# Patient Record
Sex: Female | Born: 1970 | Race: White | Hispanic: No | Marital: Married | Smoking: Former smoker
Health system: Southern US, Community
[De-identification: ages and names within clinical notes are randomized; demographics above are authoritative.]

## PROBLEM LIST (undated history)

## (undated) DIAGNOSIS — R87810 Cervical high risk human papillomavirus (HPV) DNA test positive: Secondary | ICD-10-CM

## (undated) DIAGNOSIS — F32A Depression, unspecified: Secondary | ICD-10-CM

## (undated) DIAGNOSIS — F329 Major depressive disorder, single episode, unspecified: Secondary | ICD-10-CM

## (undated) DIAGNOSIS — R52 Pain, unspecified: Secondary | ICD-10-CM

## (undated) DIAGNOSIS — R51 Headache: Secondary | ICD-10-CM

## (undated) DIAGNOSIS — H6692 Otitis media, unspecified, left ear: Secondary | ICD-10-CM

## (undated) DIAGNOSIS — J329 Chronic sinusitis, unspecified: Secondary | ICD-10-CM

## (undated) DIAGNOSIS — R87610 Atypical squamous cells of undetermined significance on cytologic smear of cervix (ASC-US): Secondary | ICD-10-CM

## (undated) HISTORY — DX: Pain, unspecified: R52

## (undated) HISTORY — DX: Headache: R51

## (undated) HISTORY — DX: Major depressive disorder, single episode, unspecified: F32.9

## (undated) HISTORY — DX: Chronic sinusitis, unspecified: J32.9

## (undated) HISTORY — DX: Cervical high risk human papillomavirus (HPV) DNA test positive: R87.810

## (undated) HISTORY — DX: Otitis media, unspecified, left ear: H66.92

## (undated) HISTORY — DX: Atypical squamous cells of undetermined significance on cytologic smear of cervix (ASC-US): R87.610

## (undated) HISTORY — DX: Depression, unspecified: F32.A

---

## 2009-07-07 ENCOUNTER — Other Ambulatory Visit: Admission: RE | Admit: 2009-07-07 | Discharge: 2009-07-07 | Payer: Self-pay | Admitting: Obstetrics & Gynecology

## 2009-08-31 ENCOUNTER — Ambulatory Visit (HOSPITAL_COMMUNITY): Admission: RE | Admit: 2009-08-31 | Discharge: 2009-08-31 | Payer: Self-pay | Admitting: Obstetrics & Gynecology

## 2009-09-21 ENCOUNTER — Ambulatory Visit (HOSPITAL_COMMUNITY): Admission: RE | Admit: 2009-09-21 | Discharge: 2009-09-21 | Payer: Self-pay | Admitting: Obstetrics & Gynecology

## 2009-10-19 ENCOUNTER — Ambulatory Visit (HOSPITAL_COMMUNITY): Admission: RE | Admit: 2009-10-19 | Discharge: 2009-10-19 | Payer: Self-pay | Admitting: Obstetrics & Gynecology

## 2009-11-16 ENCOUNTER — Ambulatory Visit (HOSPITAL_COMMUNITY): Admission: RE | Admit: 2009-11-16 | Discharge: 2009-11-16 | Payer: Self-pay | Admitting: Obstetrics & Gynecology

## 2009-12-07 ENCOUNTER — Ambulatory Visit (HOSPITAL_COMMUNITY): Admission: RE | Admit: 2009-12-07 | Discharge: 2009-12-07 | Payer: Self-pay | Admitting: Obstetrics & Gynecology

## 2011-02-03 IMAGING — US US OB FOLLOW-UP
1 series · 18 of 28 positions shown · non-contrast
Comparison: none

OBSTETRICAL ULTRASOUND:
 This ultrasound was performed in The [HOSPITAL], and the [facility] report will be stored to [REDACTED] PACS.  This report is also available in [HOSPITAL]?s accessANYware.

[Series 1: us ob follow-up · 49 acquisitions, 18 frames shown]
[im 1/49]
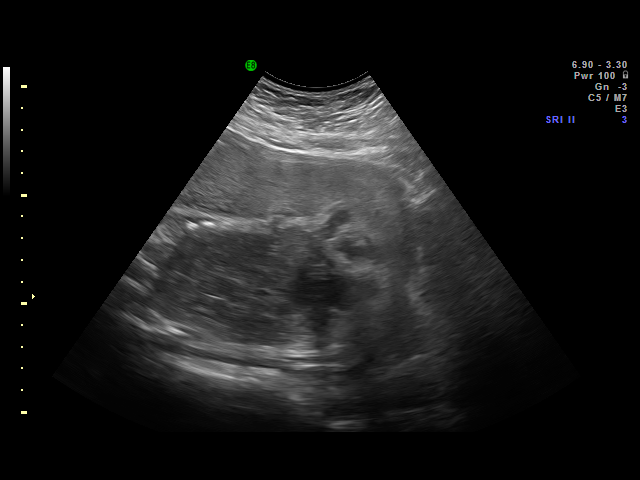
[im 4/49]
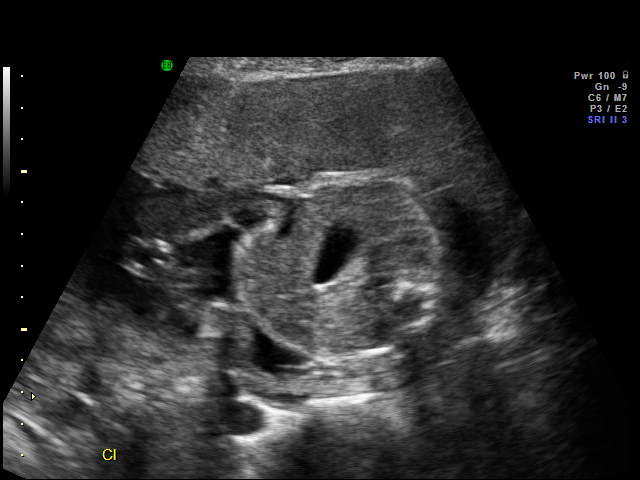
[im 6/49]
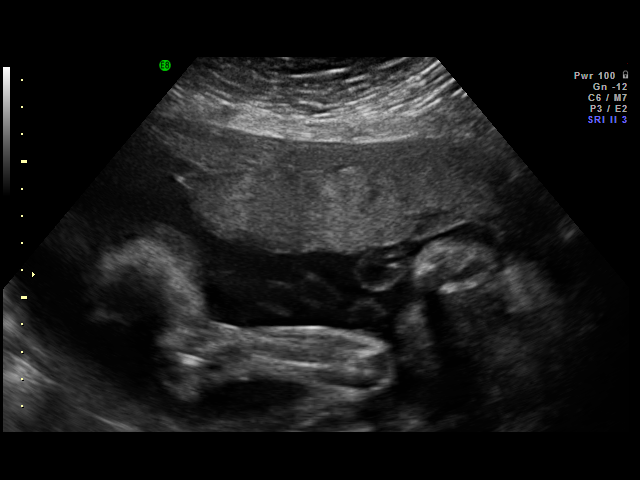
[im 9/49]
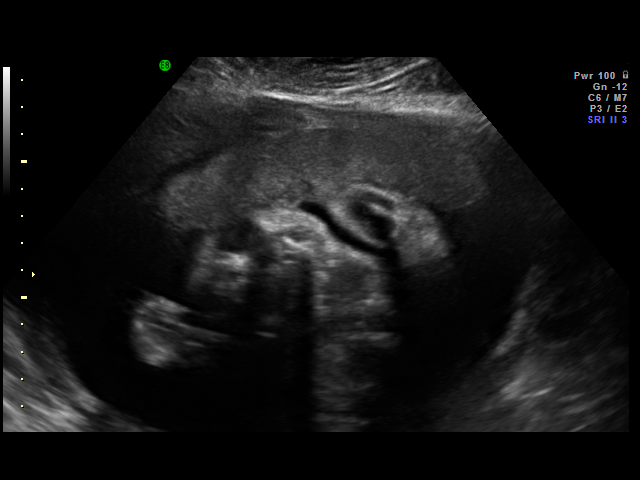
[im 13/49]
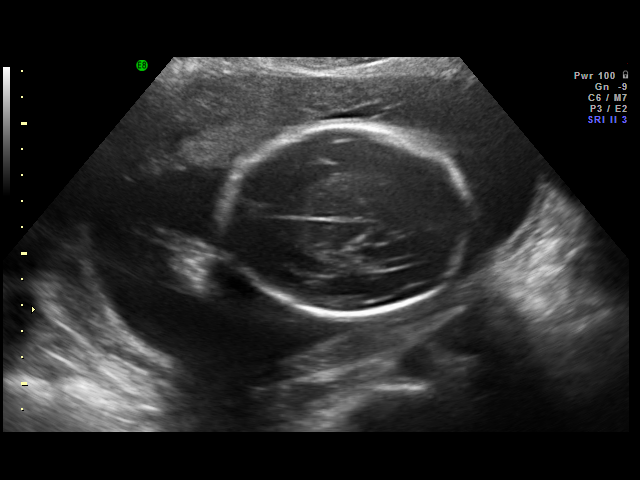
[im 15/49]
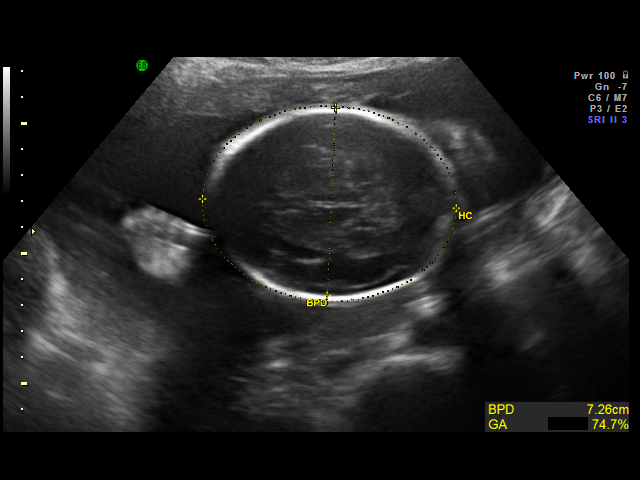
[im 18/49]
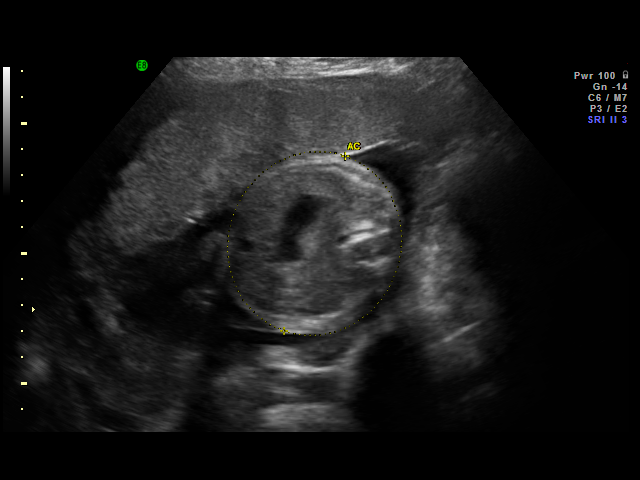
[im 20/49]
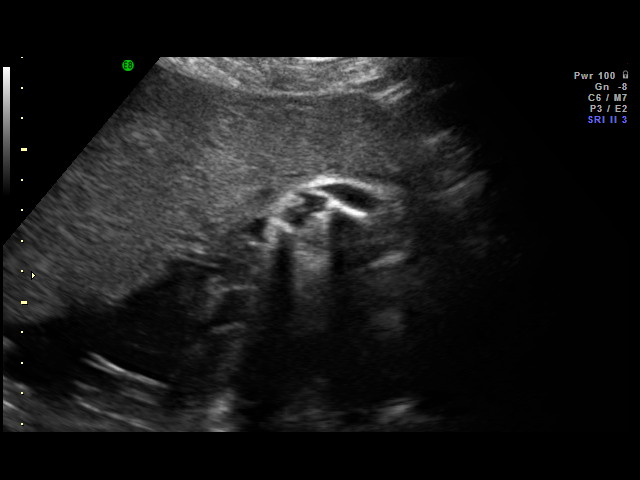
[im 24/49]
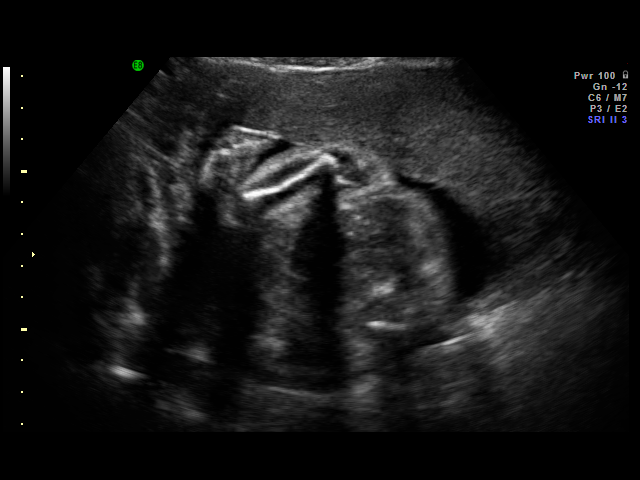
[im 25/49]
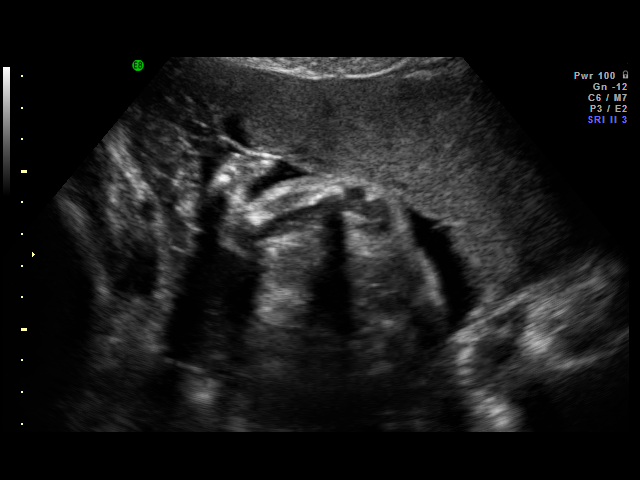
[im 29/49]
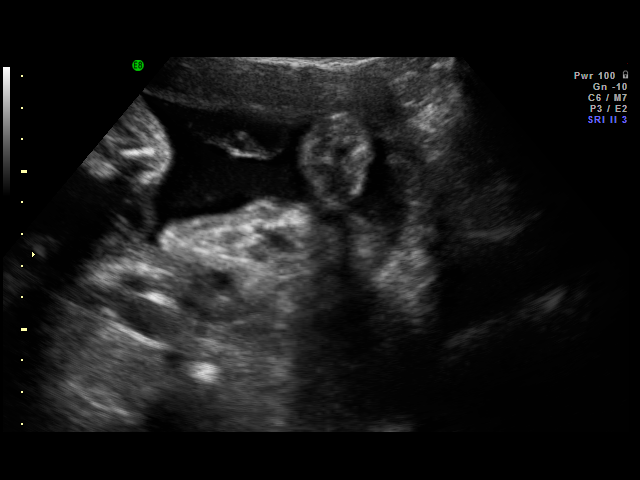
[im 31/49]
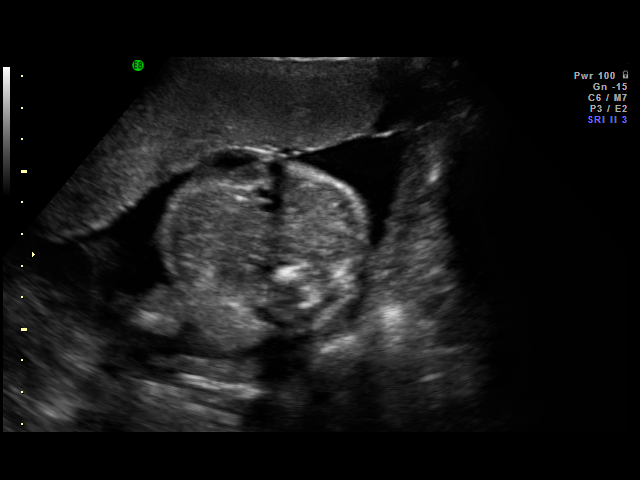
[im 34/49]
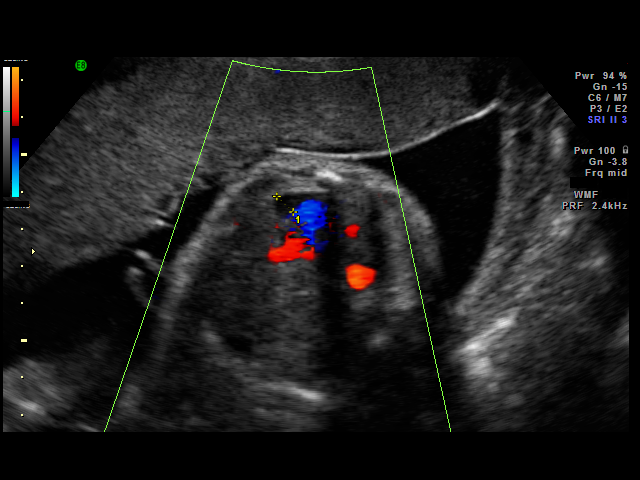
[im 38/49]
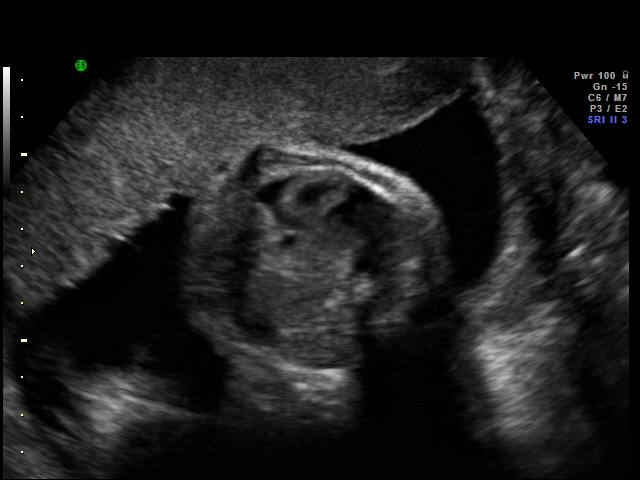
[im 40/49]
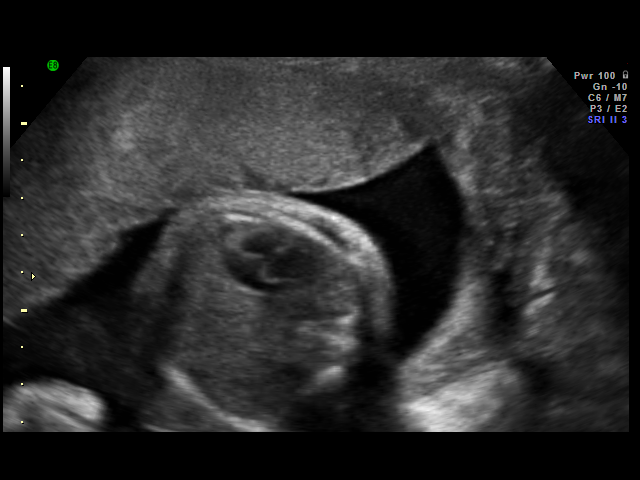
[im 43/49]
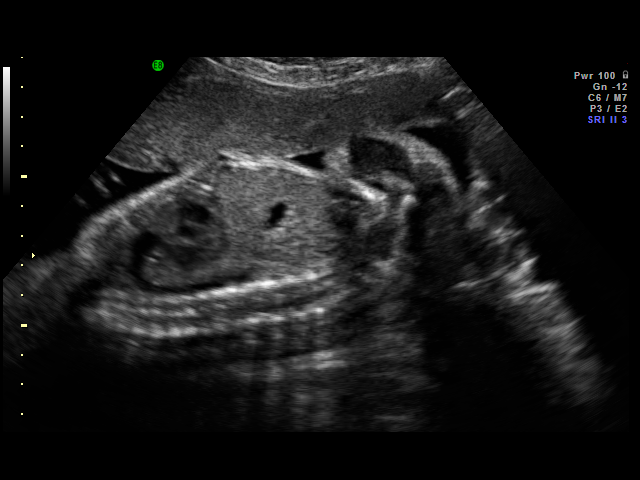
[im 45/49]
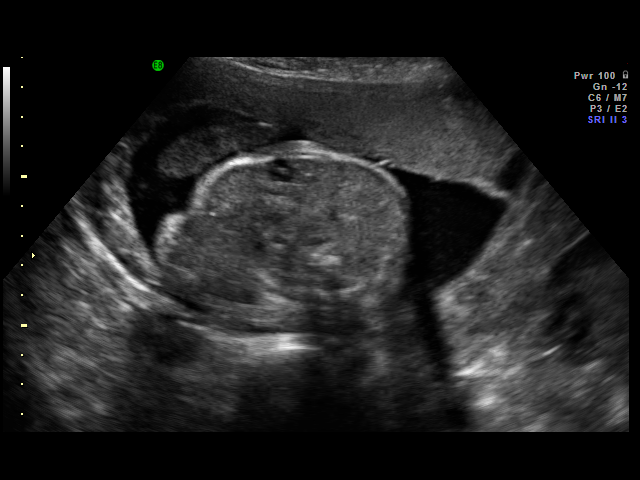
[im 49/49]
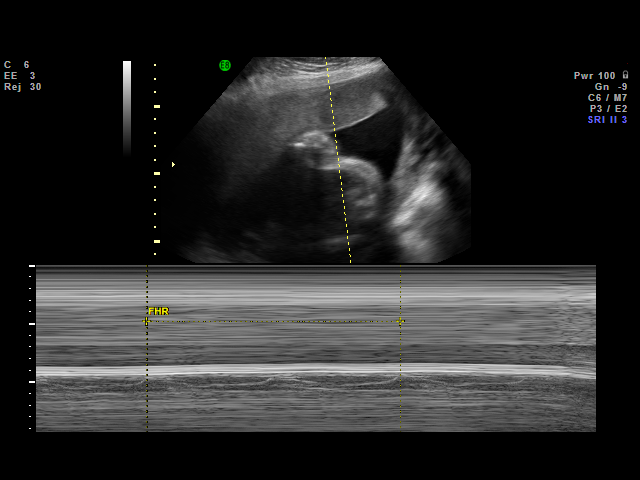

[18 of 28 positions shown; findings below may reference images not displayed]

IMPRESSION: [facility] has also been faxed to the ordering physician.

## 2011-02-22 ENCOUNTER — Other Ambulatory Visit: Payer: Self-pay | Admitting: Obstetrics & Gynecology

## 2011-02-22 ENCOUNTER — Other Ambulatory Visit (HOSPITAL_COMMUNITY)
Admission: RE | Admit: 2011-02-22 | Discharge: 2011-02-22 | Disposition: A | Discharge status: Home / Self Care | Payer: BC Managed Care – PPO | Source: Ambulatory Visit | Attending: Obstetrics and Gynecology | Admitting: Obstetrics and Gynecology

## 2011-02-22 ENCOUNTER — Other Ambulatory Visit: Payer: Self-pay | Admitting: Adult Health

## 2011-02-22 DIAGNOSIS — Z01419 Encounter for gynecological examination (general) (routine) without abnormal findings: Secondary | ICD-10-CM | POA: Insufficient documentation

## 2011-02-22 DIAGNOSIS — N63 Unspecified lump in unspecified breast: Secondary | ICD-10-CM

## 2011-03-01 ENCOUNTER — Ambulatory Visit (HOSPITAL_COMMUNITY)
Admission: RE | Admit: 2011-03-01 | Discharge: 2011-03-01 | Disposition: A | Discharge status: Home / Self Care | Payer: BC Managed Care – PPO | Source: Ambulatory Visit | Attending: Obstetrics & Gynecology | Admitting: Obstetrics & Gynecology

## 2011-03-01 ENCOUNTER — Other Ambulatory Visit: Payer: Self-pay | Admitting: Obstetrics & Gynecology

## 2011-03-01 DIAGNOSIS — N63 Unspecified lump in unspecified breast: Secondary | ICD-10-CM

## 2011-03-01 DIAGNOSIS — IMO0002 Reserved for concepts with insufficient information to code with codable children: Secondary | ICD-10-CM

## 2011-03-01 DIAGNOSIS — Z09 Encounter for follow-up examination after completed treatment for conditions other than malignant neoplasm: Secondary | ICD-10-CM

## 2011-03-01 DIAGNOSIS — R920 Mammographic microcalcification found on diagnostic imaging of breast: Secondary | ICD-10-CM | POA: Insufficient documentation

## 2011-03-24 IMAGING — US US OB FOLLOW-UP
1 series · 18 of 28 positions shown · non-contrast
Comparison: none

OBSTETRICAL ULTRASOUND:
 This ultrasound was performed in The [[facility]OS[facility]], and the [facility] report will be stored to [REDACTED] PACS.  This report is also available in [HOSPITAL]?s accessANYware.

[Series 1: us ob follow-up · 18 of 43 slices shown]
[im 1/43]
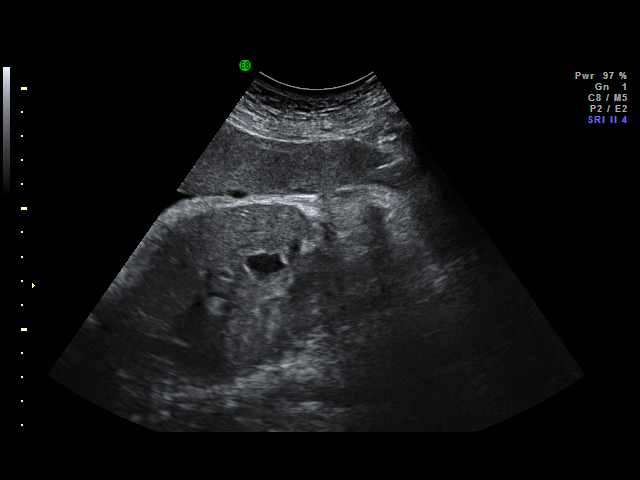
[im 4/43]
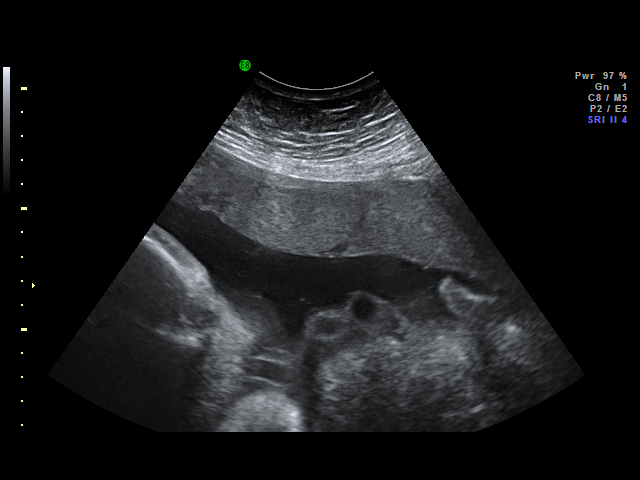
[im 5/43]
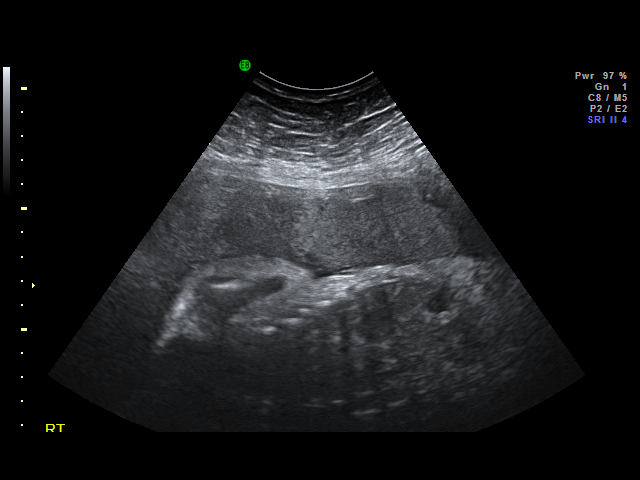
[im 8/43]
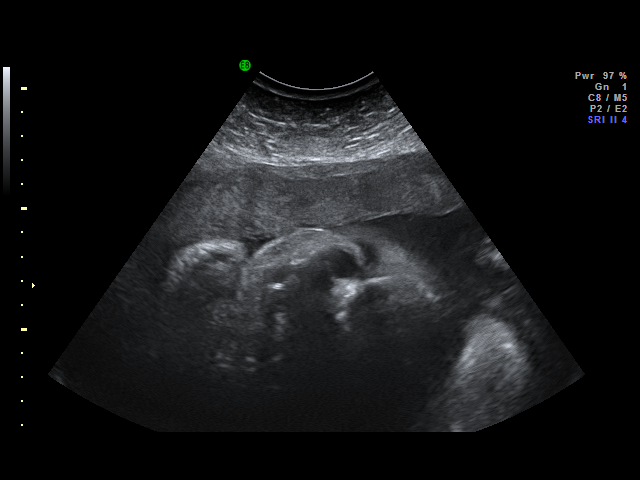
[im 11/43]
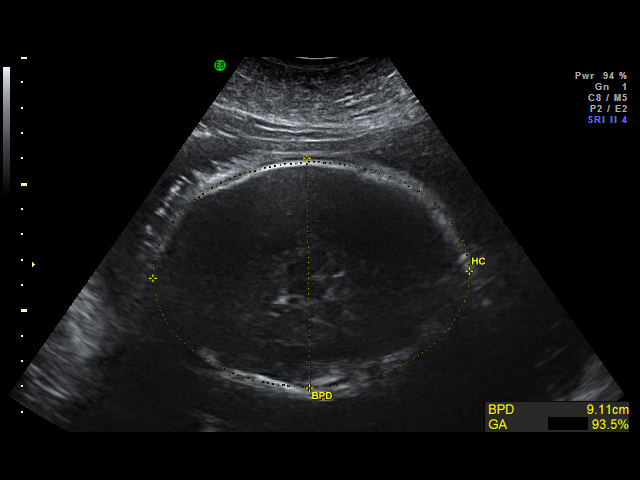
[im 13/43]
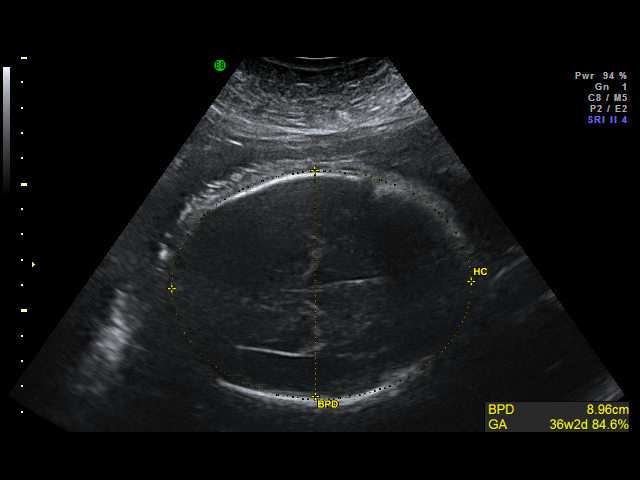
[im 16/43]
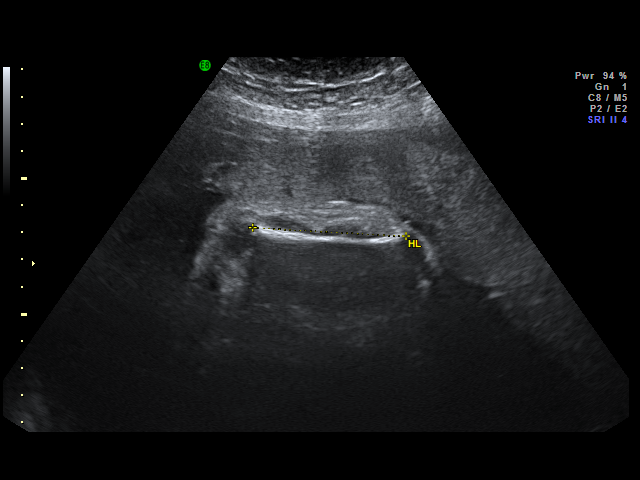
[im 18/43]
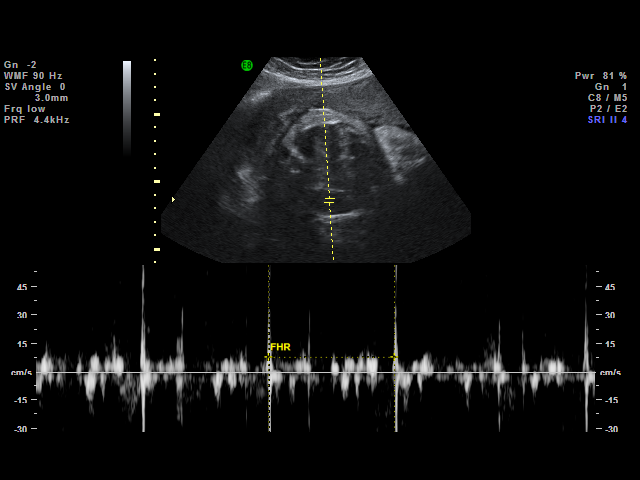
[im 21/43]
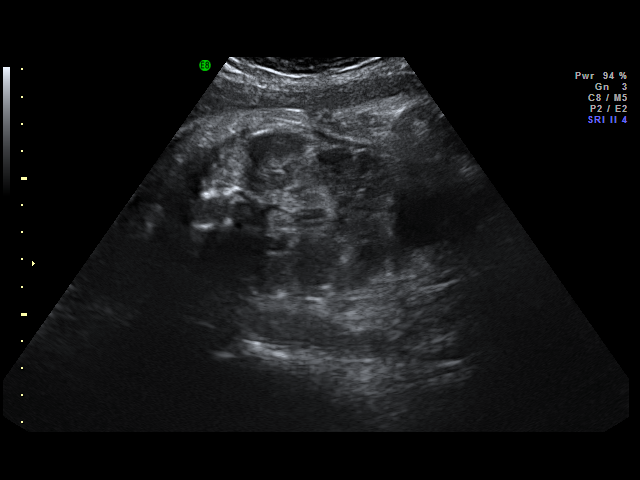
[im 22/43]
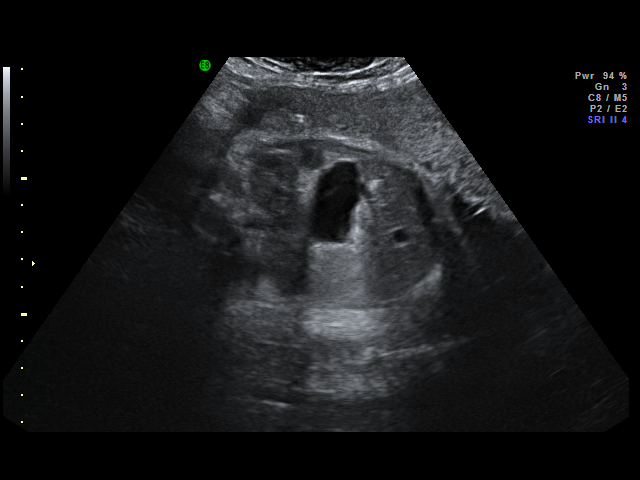
[im 25/43]
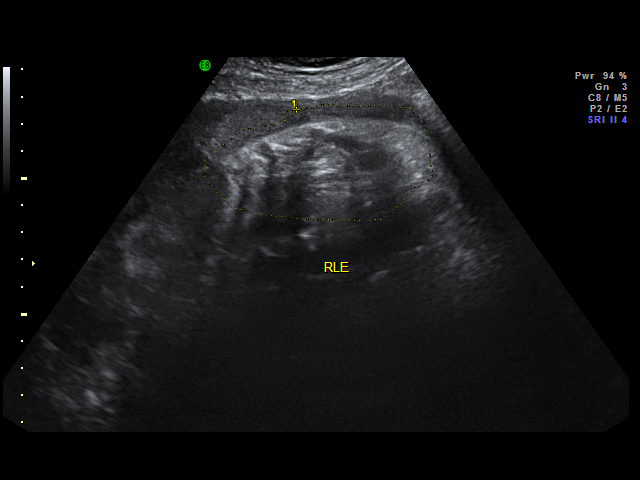
[im 27/43]
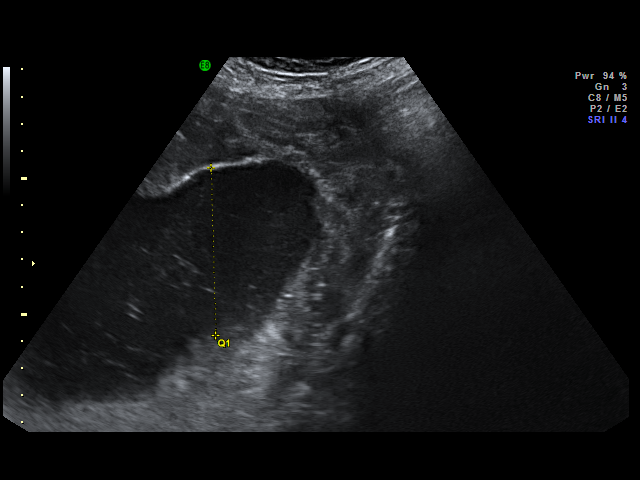
[im 30/43]
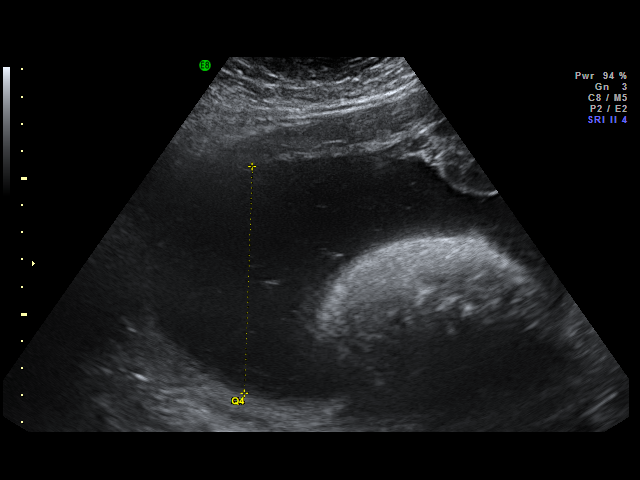
[im 33/43]
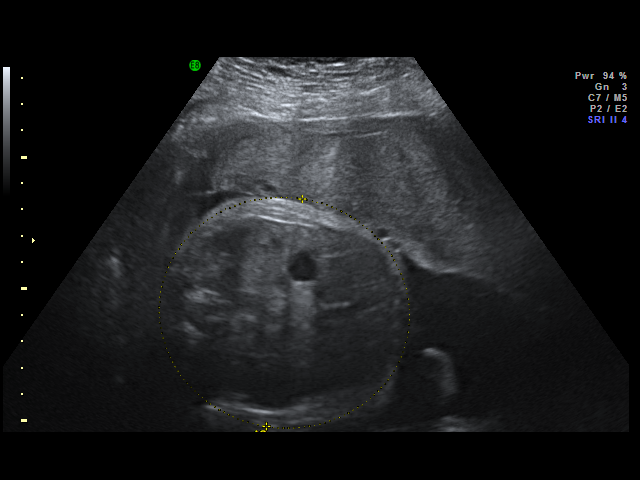
[im 35/43]
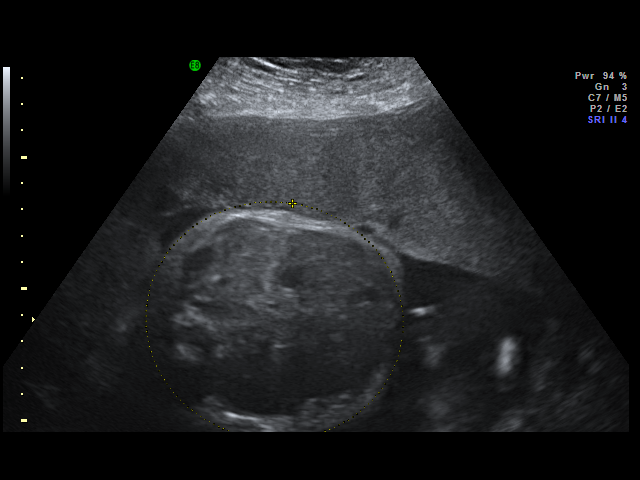
[im 38/43]
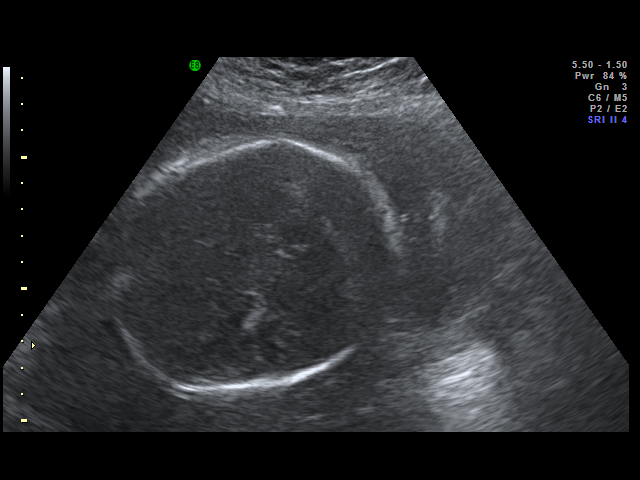
[im 39/43]
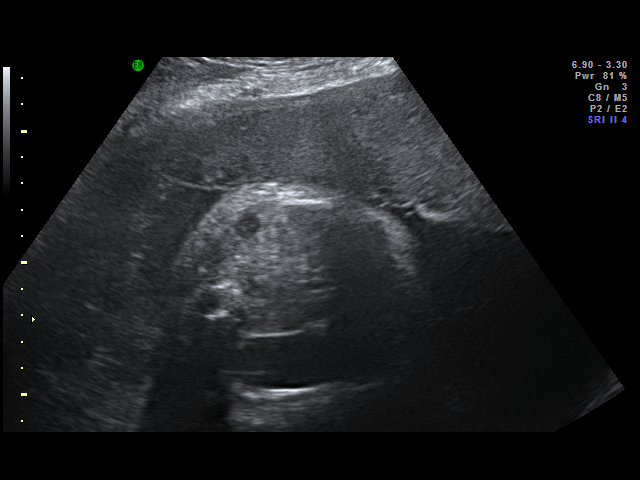
[im 43/43]
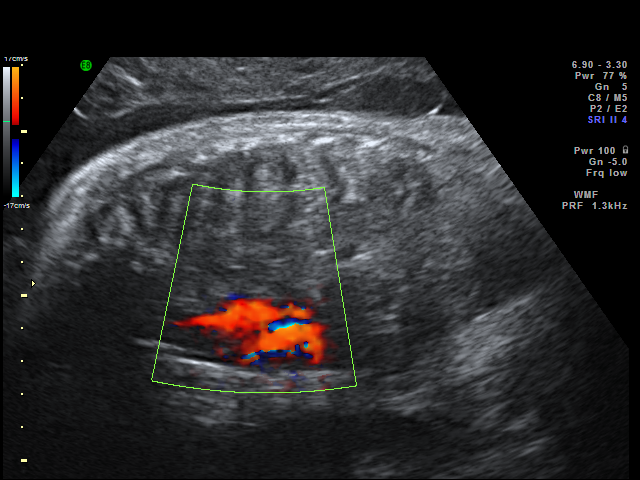

[18 of 28 positions shown; findings below may reference images not displayed]

IMPRESSION: [facility] has also been faxed to the ordering physician.

## 2011-09-06 ENCOUNTER — Ambulatory Visit (HOSPITAL_COMMUNITY)
Admission: RE | Admit: 2011-09-06 | Discharge: 2011-09-06 | Disposition: A | Discharge status: Home / Self Care | Payer: BC Managed Care – PPO | Source: Ambulatory Visit | Attending: Obstetrics & Gynecology | Admitting: Obstetrics & Gynecology

## 2011-09-06 DIAGNOSIS — R928 Other abnormal and inconclusive findings on diagnostic imaging of breast: Secondary | ICD-10-CM | POA: Insufficient documentation

## 2011-09-06 DIAGNOSIS — Z09 Encounter for follow-up examination after completed treatment for conditions other than malignant neoplasm: Secondary | ICD-10-CM | POA: Insufficient documentation

## 2012-03-27 ENCOUNTER — Other Ambulatory Visit (HOSPITAL_COMMUNITY)
Admission: RE | Admit: 2012-03-27 | Discharge: 2012-03-27 | Disposition: A | Discharge status: Home / Self Care | Payer: BC Managed Care – PPO | Source: Ambulatory Visit | Attending: Obstetrics and Gynecology | Admitting: Obstetrics and Gynecology

## 2012-03-27 ENCOUNTER — Other Ambulatory Visit: Payer: Self-pay | Admitting: Adult Health

## 2012-03-27 DIAGNOSIS — IMO0002 Reserved for concepts with insufficient information to code with codable children: Secondary | ICD-10-CM

## 2012-03-27 DIAGNOSIS — Z01419 Encounter for gynecological examination (general) (routine) without abnormal findings: Secondary | ICD-10-CM | POA: Insufficient documentation

## 2012-03-27 DIAGNOSIS — Z1159 Encounter for screening for other viral diseases: Secondary | ICD-10-CM | POA: Insufficient documentation

## 2012-04-03 ENCOUNTER — Ambulatory Visit (HOSPITAL_COMMUNITY)
Admission: RE | Admit: 2012-04-03 | Discharge: 2012-04-03 | Disposition: A | Discharge status: Home / Self Care | Payer: BC Managed Care – PPO | Source: Ambulatory Visit | Attending: Adult Health | Admitting: Adult Health

## 2012-04-03 DIAGNOSIS — Z09 Encounter for follow-up examination after completed treatment for conditions other than malignant neoplasm: Secondary | ICD-10-CM | POA: Insufficient documentation

## 2012-04-03 DIAGNOSIS — IMO0002 Reserved for concepts with insufficient information to code with codable children: Secondary | ICD-10-CM

## 2012-04-03 DIAGNOSIS — R928 Other abnormal and inconclusive findings on diagnostic imaging of breast: Secondary | ICD-10-CM | POA: Insufficient documentation

## 2013-09-03 ENCOUNTER — Other Ambulatory Visit: Payer: Self-pay | Admitting: Family Medicine

## 2013-09-03 MED ORDER — LEVOFLOXACIN 500 MG PO TABS: 500.0000 mg | ORAL_TABLET | Freq: Every day | ORAL | Status: AC

## 2013-09-03 NOTE — Progress Notes (Signed)
Seen for sinusitis. Treated with Levaquin . Lungs clear. Moderate sinusitis

## 2013-10-08 ENCOUNTER — Encounter: Payer: Self-pay | Admitting: Adult Health

## 2013-10-08 ENCOUNTER — Ambulatory Visit (INDEPENDENT_AMBULATORY_CARE_PROVIDER_SITE_OTHER): Payer: BC Managed Care – PPO | Admitting: Adult Health

## 2013-10-08 ENCOUNTER — Encounter (INDEPENDENT_AMBULATORY_CARE_PROVIDER_SITE_OTHER): Payer: Self-pay

## 2013-10-08 VITALS — BP 110/76 | HR 78 | Ht 62.5 in | Wt 183.0 lb

## 2013-10-08 DIAGNOSIS — Z1212 Encounter for screening for malignant neoplasm of rectum: Secondary | ICD-10-CM

## 2013-10-08 DIAGNOSIS — R519 Headache, unspecified: Secondary | ICD-10-CM

## 2013-10-08 DIAGNOSIS — Z01419 Encounter for gynecological examination (general) (routine) without abnormal findings: Secondary | ICD-10-CM

## 2013-10-08 HISTORY — DX: Headache, unspecified: R51.9

## 2013-10-08 LAB — HEMOCCULT GUIAC POC 1CARD (OFFICE): Fecal Occult Blood, POC: NEGATIVE

## 2013-10-08 LAB — CBC
HCT: 37.3 % (ref 36.0–46.0)
RBC: 4.32 MIL/uL (ref 3.87–5.11)
WBC: 5.8 10*3/uL (ref 4.0–10.5)

## 2013-10-08 NOTE — Patient Instructions (Signed)
Physical in 1 year Mammogram yearly Follow up labs Headaches, Frequently Asked Questions MIGRAINE HEADACHES Q: What is migraine? What causes it? How can I treat it? A: Generally, migraine headaches begin as a dull ache. Then they develop into a constant, throbbing, and pulsating pain. You may experience pain at the temples. You may experience pain at the front or back of one or both sides of the head. The pain is usually accompanied by a combination of:  Nausea.  Vomiting.  Sensitivity to light and noise. Some people (about 15%) experience an aura (see below) before an attack. The cause of migraine is believed to be chemical reactions in the brain. Treatment for migraine may include over-the-counter or prescription medications. It may also include self-help techniques. These include relaxation training and biofeedback.  Q: What is an aura? A: About 15% of people with migraine get an "aura". This is a sign of neurological symptoms that occur before a migraine headache. You may see wavy or jagged lines, dots, or flashing lights. You might experience tunnel vision or blind spots in one or both eyes. The aura can include visual or auditory hallucinations (something imagined). It may include disruptions in smell (such as strange odors), taste or touch. Other symptoms include:  Numbness.  A "pins and needles" sensation.  Difficulty in recalling or speaking the correct word. These neurological events may last as long as 60 minutes. These symptoms will fade as the headache begins. Q: What is a trigger? A: Certain physical or environmental factors can lead to or "trigger" a migraine. These include:  Foods.  Hormonal changes.  Weather.  Stress. It is important to remember that triggers are different for everyone. To help prevent migraine attacks, you need to figure out which triggers affect you. Keep a headache diary. This is a good way to track triggers. The diary will help you talk to your  healthcare professional about your condition. Q: Does weather affect migraines? A: Bright sunshine, hot, humid conditions, and drastic changes in barometric pressure may lead to, or "trigger," a migraine attack in some people. But studies have shown that weather does not act as a trigger for everyone with migraines. Q: What is the link between migraine and hormones? A: Hormones start and regulate many of your body's functions. Hormones keep your body in balance within a constantly changing environment. The levels of hormones in your body are unbalanced at times. Examples are during menstruation, pregnancy, or menopause. That can lead to a migraine attack. In fact, about three quarters of all women with migraine report that their attacks are related to the menstrual cycle.  Q: Is there an increased risk of stroke for migraine sufferers? A: The likelihood of a migraine attack causing a stroke is very remote. That is not to say that migraine sufferers cannot have a stroke associated with their migraines. In persons under age 12, the most common associated factor for stroke is migraine headache. But over the course of a person's normal life span, the occurrence of migraine headache may actually be associated with a reduced risk of dying from cerebrovascular disease due to stroke.  Q: What are acute medications for migraine? A: Acute medications are used to treat the pain of the headache after it has started. Examples over-the-counter medications, NSAIDs, ergots, and triptans.  Q: What are the triptans? A: Triptans are the newest class of abortive medications. They are specifically targeted to treat migraine. Triptans are vasoconstrictors. They moderate some chemical reactions in the brain. The  triptans work on receptors in your brain. Triptans help to restore the balance of a neurotransmitter called serotonin. Fluctuations in levels of serotonin are thought to be a main cause of migraine.  Q: Are  over-the-counter medications for migraine effective? A: Over-the-counter, or "OTC," medications may be effective in relieving mild to moderate pain and associated symptoms of migraine. But you should see your caregiver before beginning any treatment regimen for migraine.  Q: What are preventive medications for migraine? A: Preventive medications for migraine are sometimes referred to as "prophylactic" treatments. They are used to reduce the frequency, severity, and length of migraine attacks. Examples of preventive medications include antiepileptic medications, antidepressants, beta-blockers, calcium channel blockers, and NSAIDs (nonsteroidal anti-inflammatory drugs). Q: Why are anticonvulsants used to treat migraine? A: During the past few years, there has been an increased interest in antiepileptic drugs for the prevention of migraine. They are sometimes referred to as "anticonvulsants". Both epilepsy and migraine may be caused by similar reactions in the brain.  Q: Why are antidepressants used to treat migraine? A: Antidepressants are typically used to treat people with depression. They may reduce migraine frequency by regulating chemical levels, such as serotonin, in the brain.  Q: What alternative therapies are used to treat migraine? A: The term "alternative therapies" is often used to describe treatments considered outside the scope of conventional Western medicine. Examples of alternative therapy include acupuncture, acupressure, and yoga. Another common alternative treatment is herbal therapy. Some herbs are believed to relieve headache pain. Always discuss alternative therapies with your caregiver before proceeding. Some herbal products contain arsenic and other toxins. TENSION HEADACHES Q: What is a tension-type headache? What causes it? How can I treat it? A: Tension-type headaches occur randomly. They are often the result of temporary stress, anxiety, fatigue, or anger. Symptoms include  soreness in your temples, a tightening band-like sensation around your head (a "vice-like" ache). Symptoms can also include a pulling feeling, pressure sensations, and contracting head and neck muscles. The headache begins in your forehead, temples, or the back of your head and neck. Treatment for tension-type headache may include over-the-counter or prescription medications. Treatment may also include self-help techniques such as relaxation training and biofeedback. CLUSTER HEADACHES Q: What is a cluster headache? What causes it? How can I treat it? A: Cluster headache gets its name because the attacks come in groups. The pain arrives with little, if any, warning. It is usually on one side of the head. A tearing or bloodshot eye and a runny nose on the same side of the headache may also accompany the pain. Cluster headaches are believed to be caused by chemical reactions in the brain. They have been described as the most severe and intense of any headache type. Treatment for cluster headache includes prescription medication and oxygen. SINUS HEADACHES Q: What is a sinus headache? What causes it? How can I treat it? A: When a cavity in the bones of the face and skull (a sinus) becomes inflamed, the inflammation will cause localized pain. This condition is usually the result of an allergic reaction, a tumor, or an infection. If your headache is caused by a sinus blockage, such as an infection, you will probably have a fever. An x-ray will confirm a sinus blockage. Your caregiver's treatment might include antibiotics for the infection, as well as antihistamines or decongestants.  REBOUND HEADACHES Q: What is a rebound headache? What causes it? How can I treat it? A: A pattern of taking acute headache medications too often  can lead to a condition known as "rebound headache." A pattern of taking too much headache medication includes taking it more than 2 days per week or in excessive amounts. That means more  than the label or a caregiver advises. With rebound headaches, your medications not only stop relieving pain, they actually begin to cause headaches. Doctors treat rebound headache by tapering the medication that is being overused. Sometimes your caregiver will gradually substitute a different type of treatment or medication. Stopping may be a challenge. Regularly overusing a medication increases the potential for serious side effects. Consult a caregiver if you regularly use headache medications more than 2 days per week or more than the label advises. ADDITIONAL QUESTIONS AND ANSWERS Q: What is biofeedback? A: Biofeedback is a self-help treatment. Biofeedback uses special equipment to monitor your body's involuntary physical responses. Biofeedback monitors:  Breathing.  Pulse.  Heart rate.  Temperature.  Muscle tension.  Brain activity. Biofeedback helps you refine and perfect your relaxation exercises. You learn to control the physical responses that are related to stress. Once the technique has been mastered, you do not need the equipment any more. Q: Are headaches hereditary? A: Four out of five (80%) of people that suffer report a family history of migraine. Scientists are not sure if this is genetic or a family predisposition. Despite the uncertainty, a child has a 50% chance of having migraine if one parent suffers. The child has a 75% chance if both parents suffer.  Q: Can children get headaches? A: By the time they reach high school, most young people have experienced some type of headache. Many safe and effective approaches or medications can prevent a headache from occurring or stop it after it has begun.  Q: What type of doctor should I see to diagnose and treat my headache? A: Start with your primary caregiver. Discuss his or her experience and approach to headaches. Discuss methods of classification, diagnosis, and treatment. Your caregiver may decide to recommend you to a  headache specialist, depending upon your symptoms or other physical conditions. Having diabetes, allergies, etc., may require a more comprehensive and inclusive approach to your headache. The National Head[employer] Foundation will provide, upon request, a list of [facility] physician members in your state. Document Released: 01/20/2004 Document Revised: 01/22/2012 Document Reviewed: 06/29/2008 [facility] Patient Information 2014 [place], [place].

## 2013-10-08 NOTE — Progress Notes (Signed)
Patient ID: Mary Rivera, female   DOB: Dec 28, 1970, 42 y.o.   MRN: [id number] History of Present Illness: Mary Rivera is a 42 year old white female, married, works at [employer], in for a physical.She had a normal pap with negative HPV.Complains of headaches once a month around menses,OTC pain meds makes it better.   Current Medications, Allergies, Past Medical History, Past Surgical History, Family History and Social History were reviewed in [employer] record.     Review of Systems: Patient denies any blurred vision, shortness of breath, chest pain, abdominal pain, problems with bowel movements, urination, or intercourse.No joint pain or current mood swings.See positives in HPI.     Physical Exam:BP 110/76  Pulse 78  Ht 5' 2.5" (1.588 m)  Wt 183 lb (83.008 kg)  BMI 32.92 kg/m2  LMP 09/13/2013 General:  Well developed, well nourished, no acute distress Skin:  Warm and dry Neck:  Midline trachea, normal thyroid Lungs; Clear to auscultation bilaterally Breast:  No dominant palpable mass, retraction, or nipple discharge Cardiovascular: Regular rate and rhythm Abdomen:  Soft, non tender, no hepatosplenomegaly Pelvic:  External genitalia is normal in appearance, except for irritated area left labia.  The vagina is normal in appearance. The cervix is bulbous.  Uterus is felt to be normal size, shape, and contour.  No   adnexal masses or tenderness noted. Rectal: Good sphincter tone, no polyps, or hemorrhoids felt.  Hemoccult negative. Extremities:  No swelling or varicosities noted Psych:  No mood changes, alert and cooperative, seems happy   Impression: Yearly gyn exam no pap Menstrual headaches    Plan: Check CBC,CMP,TSH and lipids Physical in 1 year Mammogram yearly Discussed possibly using estrogen patch to see if prevents headaches she will think about it and call next week for labs

## 2013-10-09 LAB — LIPID PANEL
HDL: 37 mg/dL — ABNORMAL LOW (ref >39)
LDL Cholesterol: 114 mg/dL — ABNORMAL HIGH (ref 0–99)
Total CHOL/HDL Ratio: 4.4 Ratio

## 2013-10-09 LAB — COMPREHENSIVE METABOLIC PANEL
ALT: <8 U/L (ref 0–35)
AST: 13 U/L (ref 0–37)
Albumin: 4.4 g/dL (ref 3.5–5.2)
Alkaline Phosphatase: 44 U/L (ref 39–117)
Chloride: 104 mEq/L (ref 96–112)
Potassium: 3.9 mEq/L (ref 3.5–5.3)
Total Bilirubin: 0.5 mg/dL (ref 0.3–1.2)
Total Protein: 6.7 g/dL (ref 6.0–8.3)

## 2013-10-09 LAB — TSH: TSH: 2.761 u[IU]/mL (ref 0.350–4.500)

## 2013-10-13 ENCOUNTER — Telehealth: Payer: Self-pay | Admitting: Adult Health

## 2013-10-13 NOTE — Telephone Encounter (Signed)
Pt aware of labs and need to increase exercise and fish oil to get HDL up.other than that labs good

## 2014-09-14 ENCOUNTER — Encounter: Payer: Self-pay | Admitting: Adult Health

## 2014-09-29 ENCOUNTER — Other Ambulatory Visit: Payer: Self-pay | Admitting: Adult Health

## 2014-09-29 DIAGNOSIS — Z1231 Encounter for screening mammogram for malignant neoplasm of breast: Secondary | ICD-10-CM

## 2014-10-13 HISTORY — PX: BREAST SURGERY: SHX581

## 2014-10-15 ENCOUNTER — Encounter: Payer: Self-pay | Admitting: Adult Health

## 2014-10-15 ENCOUNTER — Ambulatory Visit (INDEPENDENT_AMBULATORY_CARE_PROVIDER_SITE_OTHER): Payer: BC Managed Care – PPO | Admitting: Adult Health

## 2014-10-15 VITALS — BP 116/82 | HR 78 | Temp 98.2°F | Ht 63.0 in | Wt 190.0 lb

## 2014-10-15 DIAGNOSIS — J329 Chronic sinusitis, unspecified: Secondary | ICD-10-CM | POA: Insufficient documentation

## 2014-10-15 DIAGNOSIS — Z1212 Encounter for screening for malignant neoplasm of rectum: Secondary | ICD-10-CM

## 2014-10-15 DIAGNOSIS — H6692 Otitis media, unspecified, left ear: Secondary | ICD-10-CM | POA: Insufficient documentation

## 2014-10-15 DIAGNOSIS — Z01419 Encounter for gynecological examination (general) (routine) without abnormal findings: Secondary | ICD-10-CM

## 2014-10-15 DIAGNOSIS — J0120 Acute ethmoidal sinusitis, unspecified: Secondary | ICD-10-CM

## 2014-10-15 DIAGNOSIS — H65192 Other acute nonsuppurative otitis media, left ear: Secondary | ICD-10-CM

## 2014-10-15 HISTORY — DX: Chronic sinusitis, unspecified: J32.9

## 2014-10-15 HISTORY — DX: Otitis media, unspecified, left ear: H66.92

## 2014-10-15 LAB — HEMOCCULT GUIAC POC 1CARD (OFFICE): Fecal Occult Blood, POC: NEGATIVE

## 2014-10-15 MED ORDER — FLUCONAZOLE 150 MG PO TABS: ORAL_TABLET | ORAL | Status: DC

## 2014-10-15 MED ORDER — CEPHALEXIN 500 MG PO CAPS: 500.0000 mg | ORAL_CAPSULE | Freq: Four times a day (QID) | ORAL | Status: DC

## 2014-10-15 NOTE — Progress Notes (Signed)
Patient ID: Mary Rivera, female   DOB: [birth year], 43 y.o.   MRN: [id number] History of Present Illness: Mary Rivera is a 43 year old white female,married in for gyn exam.She had a normal pap with negative HPV 5/1/513.She is complaining of head pressure and teeth hurt some, but more at bridge of nose and left ear hurts,has headache at times, too, has tried OTC meds with out relief.She works at day care.  Current Medications, Allergies, Past Medical History, Past Surgical History, Family History and Social History were reviewed in [employer] record.     Review of Systems: Patient denies any  blurred vision, shortness of breath, chest pain, abdominal pain, problems with bowel movements, urination, or intercourse. No joint pain or mood swings, see HPI for positives.    Physical Exam:BP 116/82 mmHg  Pulse 78  Temp(Src) 98.2 F (36.8 C)  Ht 5\' 3"  (1.6 m)  Wt 190 lb (86.183 kg)  BMI 33.67 kg/m2  LMP 10/02/2014 General:  Well developed, well nourished, no acute distress Skin:  Warm and dry Neck:  Midline trachea, normal thyroid, has ethmoid sinus tenderness and mild maxillary tenderness, left ear red, throat clear Lungs; Clear to auscultation bilaterally Breast:  No dominant palpable mass, retraction, or nipple discharge Cardiovascular: Regular rate and rhythm Abdomen:  Soft, non tender, no hepatosplenomegaly Pelvic:  External genitalia is normal in appearance.  The vagina is normal in appearance.  The cervix is bulbous.  Uterus is felt to be normal size, shape, and contour.  No  adnexal masses or tenderness noted. Rectal: Good sphincter tone, no polyps, or hemorrhoids felt.  Hemoccult negative. Extremities:  No swelling or varicosities noted Psych:  No mood changes,alert and cooperative, seems happy   Impression: Well woman gyn exam no pap Ethmoid sinus infection Left otitis media    Plan: Rx keflex 500 mg #28 take 1 qid x 7 days Try tylenol cold and  sinus Increase fluids Rx diflucan 150 mg #2 take 1 now and 1 in 3 days if needed with 1 refill Pap and physical in 1 year Mammogram now and yearly

## 2014-10-15 NOTE — Patient Instructions (Signed)
Otitis Media Otitis media is redness, soreness, and inflammation of the middle ear. Otitis media may be caused by allergies or, most commonly, by infection. Often it occurs as a complication of the common cold. SIGNS AND SYMPTOMS Symptoms of otitis media may include:  Earache.  Fever.  Ringing in your ear.  Headache.  Leakage of fluid from the ear. DIAGNOSIS To diagnose otitis media, your health care provider will examine your ear with an otoscope. This is an instrument that allows your health care provider to see into your ear in order to examine your eardrum. Your health care provider also will ask you questions about your symptoms. TREATMENT  Typically, otitis media resolves on its own within 3-5 days. Your health care provider may prescribe medicine to ease your symptoms of pain. If otitis media does not resolve within 5 days or is recurrent, your health care provider may prescribe antibiotic medicines if he or she suspects that a bacterial infection is the cause. HOME CARE INSTRUCTIONS   If you were prescribed an antibiotic medicine, finish it all even if you start to feel better.  Take medicines only as directed by your health care provider.  Keep all follow-up visits as directed by your health care provider. SEEK MEDICAL CARE IF:  You have otitis media only in one ear, or bleeding from your nose, or both.  You notice a lump on your neck.  You are not getting better in 3-5 days.  You feel worse instead of better. SEEK IMMEDIATE MEDICAL CARE IF:   You have pain that is not controlled with medicine.  You have swelling, redness, or pain around your ear or stiffness in your neck.  You notice that part of your face is paralyzed.  You notice that the bone behind your ear (mastoid) is tender when you touch it. MAKE SURE YOU:   Understand these instructions.  Will watch your condition.  Will get help right away if you are not doing well or get worse. Document Released:  08/04/2004 Document Revised: 03/16/2014 Document Reviewed: 05/27/2013 [facility] Patient Information 2015 [place], [place]. This information is not intended to replace advice given to you by your health care provider. Make sure you discuss any questions you have with your health care provider. Mammogram now and yearly Pap and physical in 1 year Increase fluids Try tylenol cold and sinus Take keflex

## 2014-10-20 ENCOUNTER — Ambulatory Visit (HOSPITAL_COMMUNITY)
Admission: RE | Admit: 2014-10-20 | Discharge: 2014-10-20 | Disposition: A | Discharge status: Home / Self Care | Payer: BC Managed Care – PPO | Source: Ambulatory Visit | Attending: Adult Health | Admitting: Adult Health

## 2014-10-20 ENCOUNTER — Other Ambulatory Visit: Payer: Self-pay | Admitting: Adult Health

## 2014-10-20 DIAGNOSIS — Z1231 Encounter for screening mammogram for malignant neoplasm of breast: Secondary | ICD-10-CM | POA: Diagnosis not present

## 2014-10-20 DIAGNOSIS — R921 Mammographic calcification found on diagnostic imaging of breast: Secondary | ICD-10-CM

## 2014-10-30 ENCOUNTER — Ambulatory Visit
Admission: RE | Admit: 2014-10-30 | Discharge: 2014-10-30 | Disposition: A | Discharge status: Home / Self Care | Payer: BC Managed Care – PPO | Source: Ambulatory Visit | Attending: Adult Health | Admitting: Adult Health

## 2014-10-30 DIAGNOSIS — R921 Mammographic calcification found on diagnostic imaging of breast: Secondary | ICD-10-CM

## 2014-11-28 NOTE — H&P (Signed)
  NTS SOAP Note  Vital Signs:  Vitals as of: 4/74/2595: Systolic 638: Diastolic 80: Heart Rate 75: Temp 96.58F: Height 65ft 3in: Weight 195Lbs 0 Ounces: BMI 34.54  BMI : 34.54 kg/m2  Subjective: This 44 year old female presents for of a left breast neoplasm.  Noted to have calcifications in the left breast.  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  No family h/o breast cancer.  No nipple discharge noted.  Review of Symptoms:  Constitutional:unremarkable   Head:unremarkable Eyes:unremarkable   sinus problems Cardiovascular:  unremarkable Respiratory:unremarkable Gastrointestinal:  unremarkable   Genitourinary:unremarkable   Musculoskeletal:unremarkable Skin:unremarkable as above Hematolgic/Lymphatic:unremarkable   Allergic/Immunologic:unremarkable   Past Medical History:  Reviewed  Past Medical History  Surgical History: unremarkable Medical Problems: none Allergies: nkda Medications: none   Social History:Reviewed  Social History  Preferred Language: English Race:  White Ethnicity: Other Age: 83 year Marital Status:  S Alcohol: no   Smoking Status: Never smoker reviewed on 11/26/2014 Functional Status reviewed on 11/26/2014 ------------------------------------------------ Bathing: Normal Cooking: Normal Dressing: Normal Driving: Normal Eating: Normal Managing Meds: Normal Oral Care: Normal Shopping: Normal Toileting: Normal Transferring: Normal Walking: Normal Cognitive Status reviewed on 11/26/2014 ------------------------------------------------ Attention: Normal Decision Making: Normal Language: Normal Memory: Normal Motor: Normal Perception: Normal Problem Solving: Normal Visual and Spatial: Normal   Family History:Reviewed  Family Health History Family History is Unknown    Objective Information: General:Well appearing, well nourished in no distress. Neck:Supple without lymphadenopathy.   Heart:RRR, no murmur or gallop.  Normal S1, S2.  No S3, S4.  Lungs:  CTA bilaterally, no wheezes, rhonchi, rales.  Breathing unlabored. No dominant mass,  nipple discharge,  dimpling in either breast.  Axilla negative for palpable nodes.   Path and mammogram reports reviewed. 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   Patient Education:Alternative treatments to surgery were discussed with patient (and family).  Risks and benefits  of procedure were fully explained to the patient (and family) who gave informed consent. Patient/family questions were addressed.  Follow-up:Pending Surgery

## 2014-11-30 NOTE — Patient Instructions (Signed)
Mary Rivera  11/30/2014   Your procedure is scheduled on:   12/04/2014  Report to [facility] at  615  AM.  Call this number if you have problems the morning of surgery: [phone]   Remember:   Do not eat food or drink liquids after midnight.   Take these medicines the morning of surgery with A SIP OF WATER: none   Do not wear jewelry, make-up or nail polish.  Do not wear lotions, powders, or perfumes.   Do not shave 48 hours prior to surgery. Men may shave face and neck.  Do not bring valuables to the hospital.  [facility] is not responsible for any belongings or valuables.               Contacts, dentures or bridgework may not be worn into surgery.  Leave suitcase in the car. After surgery it may be brought to your room.  For patients admitted to the hospital, discharge time is determined by your treatment team.               Patients discharged the day of surgery will not be allowed to drive home.  Name and phone number of your driver: family  Special Instructions: Shower using CHG 2 nights before surgery and the night before surgery.  If you shower the day of surgery use CHG.  Use special wash - you have one bottle of CHG for all showers.  You should use approximately 1/3 of the bottle for each shower.   Please read over the following fact sheets that you were given: Pain Booklet, Coughing and Deep Breathing, Surgical Site Infection Prevention, Anesthesia Post-op Instructions and Care and Recovery After Surgery Breast Biopsy A breast biopsy is a procedure where a sample of breast tissue is removed from your breast. The tissue is examined under a microscope to see if cancerous cells are present. A breast biopsy is done when there is:  Any undiagnosed breast mass (tumor).  Nipple abnormalities, dimpling, crusting, or ulcerations.  Abnormal discharge from the nipple, especially blood.  Redness, swelling, and pain of the breast.  Calcium deposits (calcifications) or  abnormalities seen on a mammogram, ultrasound result, or results of magnetic resonance imaging (MRI).  Suspicious changes in the breast seen on your mammogram. If the tumor is found to be cancerous (malignant), a breast biopsy can help to determine what the best treatment is for you. There are many different types of breast biopsies. Talk to your caregiver about your options and which type is best for you. LET YOUR CAREGIVER KNOW ABOUT:  Allergies to food or medicine.  Medicines taken, including vitamins, herbs, eyedrops, over-the-counter medicines, and creams.  Use of steroids (by mouth or creams).  Previous problems with anesthetics or numbing medicines.  History of bleeding problems or blood clots.  Previous surgery.  Other health problems, including diabetes and kidney problems.  Any recent colds or infections.  Possibility of pregnancy, if this applies. RISKS AND COMPLICATIONS   Bleeding.  Infection.  Allergy to medicines.  Bruising and swelling of the breast.  Alteration in the shape of the breast.  Not finding the lump or abnormality.  Needing more surgery. BEFORE THE PROCEDURE  Arrange for someone to drive you home after the procedure.  Do not smoke for 2 weeks before the procedure. Stop smoking, if you smoke.  Do not drink alcohol for 24 hours before procedure.  Wear a good support bra to the procedure. PROCEDURE  You may be given a medicine to numb the breast area (local anesthesia) or a medicine to make you sleep (general anesthesia) during the procedure. The following are the different types of biopsies that can be performed.   Fine-needle aspiration--A thin needle is attached to a syringe and inserted into the breast lump. Fluid and cells are removed and then looked at under a microscope. If the breast lump cannot be felt, an ultrasound may be used to help locate the lump and place the needle in the correct area.   Core needle biopsy--A wide, hollow  needle (core needle) is inserted into the breast lump 3-6 times to get tissue samples or cores. The samples are removed. The needle is usually placed in the correct area by using an ultrasound or X-ray.   Stereotactic biopsy--X-ray equipment and a computer are used to analyze X-ray pictures of the breast lump. The computer then finds exactly where the core needle needs to be inserted. Tissue samples are removed.   Vacuum-assisted biopsy--A small incision (less than  inch) is made in your breast. A biopsy device that includes a hollow needle and vacuum is passed through the incision and into the breast tissue. The vacuum gently draws abnormal breast tissue into the needle to remove it. This type of biopsy removes a larger tissue sample than a regular core needle biopsy. No stitches are needed, and there is usually little scarring.  Ultrasound-guided core needle biopsy--A high frequency ultrasound helps guide the core needle to the area of the mass or abnormality. An incision is made to insert the needle. Tissue samples are removed.  Open biopsy--A larger incision is made in the breast. Your caregiver will attempt to remove the whole breast lump or as much as possible. AFTER THE PROCEDURE  You will be taken to the recovery area. If you are doing well and have no problems, you will be allowed to go home.  You may notice bruising on your breast. This is normal.  Your caregiver may apply a pressure dressing on your breast for 24-48 hours. A pressure dressing is a bandage that is wrapped tightly around the chest to stop fluid from collecting underneath tissues. Document Released: 10/30/2005 Document Revised: 02/24/2013 Document Reviewed: 11/30/2011 [facility] Patient Information 2015 [place], [place]. This information is not intended to replace advice given to you by your health care provider. Make sure you discuss any questions you have with your health care provider. PATIENT  INSTRUCTIONS POST-ANESTHESIA  IMMEDIATELY FOLLOWING SURGERY:  Do not drive or operate machinery for the first twenty four hours after surgery.  Do not make any important decisions for twenty four hours after surgery or while taking narcotic pain medications or sedatives.  If you develop intractable nausea and vomiting or a severe headache please notify your doctor immediately.  FOLLOW-UP:  Please make an appointment with your surgeon as instructed. You do not need to follow up with anesthesia unless specifically instructed to do so.  WOUND CARE INSTRUCTIONS (if applicable):  Keep a dry clean dressing on the anesthesia/puncture wound site if there is drainage.  Once the wound has quit draining you may leave it open to air.  Generally you should leave the bandage intact for twenty four hours unless there is drainage.  If the epidural site drains for more than 36-48 hours please call the anesthesia department.  QUESTIONS?:  Please feel free to call your physician or the hospital operator if you have any questions, and they will be happy to assist you.

## 2014-12-01 ENCOUNTER — Encounter (HOSPITAL_COMMUNITY): Payer: Self-pay

## 2014-12-01 ENCOUNTER — Encounter (HOSPITAL_COMMUNITY)
Admission: RE | Admit: 2014-12-01 | Discharge: 2014-12-01 | Disposition: A | Discharge status: Home / Self Care | Payer: BLUE CROSS/BLUE SHIELD | Source: Ambulatory Visit | Attending: General Surgery | Admitting: General Surgery

## 2014-12-01 DIAGNOSIS — D4862 Neoplasm of uncertain behavior of left breast: Secondary | ICD-10-CM | POA: Insufficient documentation

## 2014-12-01 LAB — CBC WITH DIFFERENTIAL/PLATELET
Basophils Absolute: 0 10*3/uL (ref 0.0–0.1)
Basophils Relative: 1 % (ref 0–1)
EOS ABS: 0.1 10*3/uL (ref 0.0–0.7)
EOS PCT: 1 % (ref 0–5)
HEMATOCRIT: 37.2 % (ref 36.0–46.0)
Hemoglobin: 12.1 g/dL (ref 12.0–15.0)
Lymphocytes Relative: 21 % (ref 12–46)
Lymphs Abs: 1.4 10*3/uL (ref 0.7–4.0)
MCH: 29.7 pg (ref 26.0–34.0)
MCHC: 32.5 g/dL (ref 30.0–36.0)
MCV: 91.2 fL (ref 78.0–100.0)
MONO ABS: 0.6 10*3/uL (ref 0.1–1.0)
MONOS PCT: 10 % (ref 3–12)
Neutro Abs: 4.5 10*3/uL (ref 1.7–7.7)
Neutrophils Relative %: 67 % (ref 43–77)
PLATELETS: 371 10*3/uL (ref 150–400)
RBC: 4.08 MIL/uL (ref 3.87–5.11)
RDW: 13 % (ref 11.5–15.5)
WBC: 6.6 10*3/uL (ref 4.0–10.5)

## 2014-12-01 LAB — BASIC METABOLIC PANEL
ANION GAP: 5 (ref 5–15)
BUN: 8 mg/dL (ref 6–23)
CALCIUM: 9.1 mg/dL (ref 8.4–10.5)
CO2: 26 mmol/L (ref 19–32)
Chloride: 107 mEq/L (ref 96–112)
Creatinine, Ser: 0.55 mg/dL (ref 0.50–1.10)
GFR calc non Af Amer: >90 mL/min (ref >90)
Glucose, Bld: 103 mg/dL — ABNORMAL HIGH (ref 70–99)
Potassium: 3.9 mmol/L (ref 3.5–5.1)
Sodium: 138 mmol/L (ref 135–145)

## 2014-12-01 LAB — HCG, SERUM, QUALITATIVE: PREG SERUM: NEGATIVE

## 2014-12-01 MED ORDER — CHLORHEXIDINE GLUCONATE 4 % EX LIQD: 1.0000 "application " | Freq: Once | CUTANEOUS | Status: DC

## 2014-12-11 ENCOUNTER — Ambulatory Visit (HOSPITAL_COMMUNITY)
Admission: RE | Admit: 2014-12-11 | Discharge: 2014-12-11 | Disposition: A | Discharge status: Home / Self Care | Payer: BLUE CROSS/BLUE SHIELD | Source: Ambulatory Visit | Attending: General Surgery | Admitting: General Surgery

## 2014-12-11 ENCOUNTER — Ambulatory Visit (HOSPITAL_COMMUNITY): Payer: BLUE CROSS/BLUE SHIELD | Admitting: Anesthesiology

## 2014-12-11 ENCOUNTER — Ambulatory Visit (HOSPITAL_COMMUNITY): Payer: BLUE CROSS/BLUE SHIELD

## 2014-12-11 ENCOUNTER — Encounter (HOSPITAL_COMMUNITY): Payer: Self-pay | Admitting: *Deleted

## 2014-12-11 ENCOUNTER — Encounter (HOSPITAL_COMMUNITY)
Admission: RE | Disposition: A | Discharge status: Home / Self Care | Payer: Self-pay | Source: Ambulatory Visit | Attending: General Surgery

## 2014-12-11 DIAGNOSIS — N6320 Unspecified lump in the left breast, unspecified quadrant: Secondary | ICD-10-CM

## 2014-12-11 DIAGNOSIS — N6012 Diffuse cystic mastopathy of left breast: Secondary | ICD-10-CM | POA: Insufficient documentation

## 2014-12-11 DIAGNOSIS — N6092 Unspecified benign mammary dysplasia of left breast: Secondary | ICD-10-CM | POA: Insufficient documentation

## 2014-12-11 DIAGNOSIS — D493 Neoplasm of unspecified behavior of breast: Secondary | ICD-10-CM | POA: Diagnosis present

## 2014-12-11 HISTORY — PX: BREAST BIOPSY: SHX20

## 2014-12-11 SURGERY — BREAST BIOPSY
Anesthesia: General | Site: Breast | Laterality: Left

## 2014-12-11 MED ORDER — FENTANYL CITRATE 0.05 MG/ML IJ SOLN: 25.0000 ug | INTRAMUSCULAR | Status: DC | PRN

## 2014-12-11 MED ORDER — PROPOFOL 10 MG/ML IV BOLUS
INTRAVENOUS | Status: AC
  Filled 2014-12-11: qty 20

## 2014-12-11 MED ORDER — ONDANSETRON HCL 4 MG/2ML IJ SOLN
4.0000 mg | Freq: Once | INTRAMUSCULAR | Status: AC
  Administered 2014-12-11: 4 mg via INTRAVENOUS

## 2014-12-11 MED ORDER — PROPOFOL 10 MG/ML IV BOLUS
INTRAVENOUS | Status: DC | PRN
  Administered 2014-12-11: 140 mg via INTRAVENOUS

## 2014-12-11 MED ORDER — 0.9 % SODIUM CHLORIDE (POUR BTL) OPTIME
TOPICAL | Status: DC | PRN
Start: 2014-12-11 — End: 2014-12-11
  Administered 2014-12-11: 1000 mL

## 2014-12-11 MED ORDER — LACTATED RINGERS IV SOLN
INTRAVENOUS | Status: DC
  Administered 2014-12-11: 1000 mL via INTRAVENOUS

## 2014-12-11 MED ORDER — MIDAZOLAM HCL 2 MG/2ML IJ SOLN
INTRAMUSCULAR | Status: AC
  Filled 2014-12-11: qty 2

## 2014-12-11 MED ORDER — ONDANSETRON HCL 4 MG/2ML IJ SOLN: 4.0000 mg | Freq: Once | INTRAMUSCULAR | Status: DC | PRN

## 2014-12-11 MED ORDER — FENTANYL CITRATE 0.05 MG/ML IJ SOLN
INTRAMUSCULAR | Status: DC | PRN
  Administered 2014-12-11 (×4): 50 ug via INTRAVENOUS

## 2014-12-11 MED ORDER — FENTANYL CITRATE 0.05 MG/ML IJ SOLN
INTRAMUSCULAR | Status: AC
  Filled 2014-12-11: qty 2

## 2014-12-11 MED ORDER — LIDOCAINE HCL 1 % IJ SOLN
INTRAMUSCULAR | Status: DC | PRN
  Administered 2014-12-11: 30 mg via INTRADERMAL

## 2014-12-11 MED ORDER — KETOROLAC TROMETHAMINE 30 MG/ML IJ SOLN
30.0000 mg | Freq: Once | INTRAMUSCULAR | Status: AC
  Administered 2014-12-11: 30 mg via INTRAVENOUS
  Filled 2014-12-11: qty 1

## 2014-12-11 MED ORDER — HYDROCODONE-ACETAMINOPHEN 5-325 MG PO TABS: 1.0000 | ORAL_TABLET | ORAL | Status: AC | PRN

## 2014-12-11 MED ORDER — MIDAZOLAM HCL 2 MG/2ML IJ SOLN
1.0000 mg | INTRAMUSCULAR | Status: DC | PRN
  Administered 2014-12-11: 2 mg via INTRAVENOUS

## 2014-12-11 MED ORDER — ONDANSETRON HCL 4 MG/2ML IJ SOLN
INTRAMUSCULAR | Status: AC
  Filled 2014-12-11: qty 2

## 2014-12-11 MED ORDER — FENTANYL CITRATE 0.05 MG/ML IJ SOLN
INTRAMUSCULAR | Status: AC
Start: 2014-12-11 — End: 2014-12-11
  Filled 2014-12-11: qty 2

## 2014-12-11 MED ORDER — FENTANYL CITRATE 0.05 MG/ML IJ SOLN
25.0000 ug | INTRAMUSCULAR | Status: AC
  Administered 2014-12-11 (×2): 25 ug via INTRAVENOUS

## 2014-12-11 MED ORDER — MIDAZOLAM HCL 5 MG/5ML IJ SOLN
INTRAMUSCULAR | Status: DC | PRN
  Administered 2014-12-11: 2 mg via INTRAVENOUS

## 2014-12-11 MED ORDER — DEXAMETHASONE SODIUM PHOSPHATE 4 MG/ML IJ SOLN
4.0000 mg | Freq: Once | INTRAMUSCULAR | Status: AC
  Administered 2014-12-11: 4 mg via INTRAVENOUS

## 2014-12-11 MED ORDER — DEXAMETHASONE SODIUM PHOSPHATE 4 MG/ML IJ SOLN
INTRAMUSCULAR | Status: AC
Start: 2014-12-11 — End: 2014-12-11
  Filled 2014-12-11: qty 1

## 2014-12-11 MED ORDER — BUPIVACAINE HCL (PF) 0.5 % IJ SOLN
INTRAMUSCULAR | Status: AC
Start: 2014-12-11 — End: 2014-12-11
  Filled 2014-12-11: qty 30

## 2014-12-11 MED ORDER — BUPIVACAINE HCL (PF) 0.5 % IJ SOLN
INTRAMUSCULAR | Status: DC | PRN
  Administered 2014-12-11: 10 mL

## 2014-12-11 SURGICAL SUPPLY — 36 items
14759 ×1 IMPLANT
BAG HAMPER (MISCELLANEOUS) ×2 IMPLANT
BLADE 15 SAFETY STRL DISP (BLADE) ×1 IMPLANT
CHLORAPREP W/TINT 26ML (MISCELLANEOUS) ×2 IMPLANT
CLOTH BEACON ORANGE TIMEOUT ST (SAFETY) ×2 IMPLANT
COVER LIGHT HANDLE STERIS (MISCELLANEOUS) ×4 IMPLANT
ELECT REM PT RETURN 9FT ADLT (ELECTROSURGICAL) ×2
ELECTRODE REM PT RTRN 9FT ADLT (ELECTROSURGICAL) ×1 IMPLANT
FORMALIN 10 PREFIL 120ML (MISCELLANEOUS) ×1 IMPLANT
GLOVE BIOGEL M 7.0 STRL (GLOVE) ×1 IMPLANT
GLOVE BIOGEL PI IND STRL 7.0 (GLOVE) IMPLANT
GLOVE BIOGEL PI INDICATOR 7.0 (GLOVE) ×2
GLOVE EXAM NITRILE MD LF STRL (GLOVE) ×1 IMPLANT
GLOVE SKINSENSE NS SZ6.5 (GLOVE) ×1
GLOVE SKINSENSE STRL SZ6.5 (GLOVE) IMPLANT
GLOVE SURG SS PI 7.5 STRL IVOR (GLOVE) ×4 IMPLANT
GOWN STRL REUS W/TWL LRG LVL3 (GOWN DISPOSABLE) ×5 IMPLANT
KIT ROOM TURNOVER APOR (KITS) ×2 IMPLANT
LIQUID BAND (GAUZE/BANDAGES/DRESSINGS) ×1 IMPLANT
MANIFOLD NEPTUNE II (INSTRUMENTS) ×2 IMPLANT
NDL HYPO 18GX1.5 BLUNT FILL (NEEDLE) ×1 IMPLANT
NDL HYPO 25X1 1.5 SAFETY (NEEDLE) ×1 IMPLANT
NEEDLE HYPO 18GX1.5 BLUNT FILL (NEEDLE) IMPLANT
NEEDLE HYPO 25X1 1.5 SAFETY (NEEDLE) ×2 IMPLANT
NS IRRIG 1000ML POUR BTL (IV SOLUTION) ×2 IMPLANT
PACK MINOR (CUSTOM PROCEDURE TRAY) ×2 IMPLANT
PAD ARMBOARD 7.5X6 YLW CONV (MISCELLANEOUS) ×2 IMPLANT
SET BASIN LINEN APH (SET/KITS/TRAYS/PACK) ×2 IMPLANT
SPONGE GAUZE 2X2 8PLY STRL LF (GAUZE/BANDAGES/DRESSINGS) IMPLANT
STRIP CLOSURE SKIN 1/4X3 (GAUZE/BANDAGES/DRESSINGS) IMPLANT
SUT SILK 2 0 SH (SUTURE) ×1 IMPLANT
SUT VIC AB 3-0 SH 27 (SUTURE)
SUT VIC AB 3-0 SH 27X BRD (SUTURE) ×1 IMPLANT
SUT VIC AB 4-0 PS2 27 (SUTURE) ×2 IMPLANT
SYR CONTROL 10ML LL (SYRINGE) ×1 IMPLANT
SYRINGE 10CC LL (SYRINGE) ×1 IMPLANT

## 2014-12-11 NOTE — Anesthesia Procedure Notes (Signed)
Procedure Name: LMA Insertion Date/Time: 12/11/2014 7:38 AM Performed by: Charmaine Downs Pre-anesthesia Checklist: Emergency Drugs available, Patient identified, Suction available and Patient being monitored Patient Re-evaluated:Patient Re-evaluated prior to inductionOxygen Delivery Method: Circle system utilized Preoxygenation: Pre-oxygenation with 100% oxygen Intubation Type: IV induction Ventilation: Mask ventilation without difficulty LMA: LMA inserted LMA Size: 3.0 Grade View: Grade II Tube type: Oral Number of attempts: 1 Placement Confirmation: positive ETCO2 and breath sounds checked- equal and bilateral Tube secured with: Tape Dental Injury: Teeth and Oropharynx as per pre-operative assessment

## 2014-12-11 NOTE — Anesthesia Postprocedure Evaluation (Signed)
  Anesthesia Post-op Note  Patient: Mary Rivera  Procedure(s) Performed: Procedure(s): LEFT BREAST BIOPSY (Left)  Patient Location: PACU  Anesthesia Type:General  Level of Consciousness: awake, alert , oriented and patient cooperative  Airway and Oxygen Therapy: Patient Spontanous Breathing  Post-op Pain: 2 /10, mild  Post-op Assessment: Post-op Vital signs reviewed, Patient's Cardiovascular Status Stable, Respiratory Function Stable, Patent Airway, Pain level controlled and No headache  Post-op Vital Signs: Reviewed and stable  Last Vitals:  Filed Vitals:   12/11/14 0833  BP: 128/76  Pulse: 82  Temp:   Resp: 14    Complications: No apparent anesthesia complications

## 2014-12-11 NOTE — Op Note (Signed)
Patient:  Mary Rivera  DOB:  January 29, 1971  MRN:  [id number]   Preop Diagnosis:  Left breast neoplasm, unspecified  Postop Diagnosis:  Same  Procedure:  Left breast biopsy  Surgeon:  Aviva Signs, M.D.  Anes:  Gen.  Indications:  Patient is a 44 year old white female who presents with a suspicious lesion behind the left nipple. She now presents for left breast biopsy. The risks and benefits of the procedure including bleeding and infection were fully explained to the patient, who gave informed consent.  Procedure note:  The patient was placed the supine position. After induction of general endotracheal anesthesia, the left breast was prepped and draped using usual sterile technique with ChloraPrep. Surgical site confirmation was performed.  An infra areolar incision was made. The breast tissue behind the left nipple was excised without difficulty. Was sent to radiology. X-rays confirmed the previously placed clip to be in the specimen removed. The specimen was then sent to pathology for further examination. Any bleeding was controlled using Bovie electrocautery. 0.5% Sensorcaine was instilled the surrounding wound. The skin was closed using a 4-0 Vicryl subcuticular suture. Liquid barium was applied.  All tape and needle counts were correct the end of the procedure. The patient was extubated in the operating room and transferred to PACU in stable condition.  Complications:  None  EBL:  Minimal  Specimen:  Left breast tissue

## 2014-12-11 NOTE — Transfer of Care (Signed)
Immediate Anesthesia Transfer of Care Note  Patient: Mary Rivera  Procedure(s) Performed: Procedure(s): LEFT BREAST BIOPSY (Left)  Patient Location: PACU  Anesthesia Type:General  Level of Consciousness: awake, alert  and patient cooperative  Airway & Oxygen Therapy: Patient Spontanous Breathing and Patient connected to face mask oxygen  Post-op Assessment: Report given to RN, Post -op Vital signs reviewed and stable and Patient moving all extremities  Post vital signs: Reviewed and stable  Last Vitals:  Filed Vitals:   12/11/14 0715  BP: 116/85  Pulse:   Temp:   Resp: 16    Complications: No apparent anesthesia complications

## 2014-12-11 NOTE — Interval H&P Note (Signed)
History and Physical Interval Note:  12/11/2014 7:21 AM  Mary Rivera  has presented today for surgery, with the diagnosis of left breast neoplasm unspecified  The various methods of treatment have been discussed with the patient and family. After consideration of risks, benefits and other options for treatment, the patient has consented to  Procedure(s) with comments: BREAST BIOPSY (Left) - Claiborne Billings @ office notified pt of new surgery date and to arrive at 6:15am as a surgical intervention .  The patient's history has been reviewed, patient examined, no change in status, stable for surgery.  I have reviewed the patient's chart and labs.  Questions were answered to the patient's satisfaction.     Aviva Signs A

## 2014-12-11 NOTE — Discharge Instructions (Signed)
Breast Biopsy  °Care After °Refer to this sheet in the next few weeks. These instructions provide you with information on caring for yourself after your procedure. Your caregiver may also give you more specific instructions. Your treatment has been planned according to current medical practices, but problems sometimes occur. Call your caregiver if you have any problems or questions after your procedure. °HOME CARE INSTRUCTIONS  °· Only take over-the-counter or prescription medicines for pain, discomfort, or fever as directed by your caregiver. °· Do not take aspirin. It can cause bleeding. °· Keep stitches dry when bathing. °· Protect the biopsy area. Do not let the area get bumped. °· Avoid activities that may pull the incision site open until approved by your caregiver. This can include stretching, reaching, exercise, sports, or lifting over 3 pounds. °· Resume your usual diet. °· Wear a good support bra for as long as directed by your caregiver. °· Change any bandages (dressings) as directed by your caregiver. °· Do not drink alcohol while taking pain medicine. °· Keep all your follow-up appointments with your caregiver. Ask when your test results will be ready. Make sure you get your test results. °SEEK MEDICAL CARE IF:  °· You have redness, swelling, or increasing pain in the biopsy site. °· You have a bad smell coming from the biopsy site or dressing. °· Your biopsy site breaks open after the stitches (sutures), staples, or skin adhesive strips have been removed. °· You have a rash. °· You need stronger medicine. °SEEK IMMEDIATE MEDICAL CARE IF:  °· You have a fever. °· You have increased bleeding (more than a small spot) from the biopsy site. °· You have difficulty breathing. °· You have pus coming from the biopsy site. °MAKE SURE YOU: °· Understand these instructions. °· Will watch your condition. °· Will get help right away if you are not doing well or get worse. °Document Released: 05/19/2005 Document  Revised: 01/22/2012 Document Reviewed: 11/30/2011 °[employer]® Patient Information ©2015 [employer]. This information is not intended to replace advice given to you by your health care provider. Make sure you discuss any questions you have with your health care provider. ° °

## 2014-12-11 NOTE — Anesthesia Preprocedure Evaluation (Signed)
Anesthesia Evaluation  Patient identified by MRN, date of birth, ID band Patient awake    Reviewed: Allergy & Precautions, NPO status , Patient's Chart, lab work & pertinent test results  Airway Mallampati: II  TM Distance: >3 FB     Dental  (+) Teeth Intact   Pulmonary former smoker,  breath sounds clear to auscultation        Cardiovascular negative cardio ROS  Rhythm:Regular Rate:Normal     Neuro/Psych  Headaches, PSYCHIATRIC DISORDERS Depression    GI/Hepatic negative GI ROS,   Endo/Other    Renal/GU      Musculoskeletal   Abdominal   Peds  Hematology   Anesthesia Other Findings   Reproductive/Obstetrics                             Anesthesia Physical Anesthesia Plan  ASA: II  Anesthesia Plan: General   Post-op Pain Management:    Induction: Intravenous  Airway Management Planned: LMA  Additional Equipment:   Intra-op Plan:   Post-operative Plan: Extubation in OR  Informed Consent: I have reviewed the patients History and Physical, chart, labs and discussed the procedure including the risks, benefits and alternatives for the proposed anesthesia with the patient or authorized representative who has indicated his/her understanding and acceptance.   Dental advisory given  Plan Discussed with:   Anesthesia Plan Comments:         Anesthesia Quick Evaluation

## 2014-12-14 ENCOUNTER — Encounter (HOSPITAL_COMMUNITY): Payer: Self-pay | Admitting: General Surgery

## 2015-10-18 ENCOUNTER — Other Ambulatory Visit: Payer: Self-pay | Admitting: Adult Health

## 2015-10-18 ENCOUNTER — Encounter: Payer: Self-pay | Admitting: Adult Health

## 2015-10-18 ENCOUNTER — Ambulatory Visit (INDEPENDENT_AMBULATORY_CARE_PROVIDER_SITE_OTHER): Payer: BLUE CROSS/BLUE SHIELD | Admitting: Adult Health

## 2015-10-18 ENCOUNTER — Telehealth: Payer: Self-pay | Admitting: *Deleted

## 2015-10-18 ENCOUNTER — Other Ambulatory Visit (HOSPITAL_COMMUNITY)
Admission: RE | Admit: 2015-10-18 | Discharge: 2015-10-18 | Disposition: A | Discharge status: Home / Self Care | Payer: BLUE CROSS/BLUE SHIELD | Source: Ambulatory Visit | Attending: Adult Health | Admitting: Adult Health

## 2015-10-18 VITALS — BP 110/88 | HR 78 | Ht 62.25 in | Wt 181.5 lb

## 2015-10-18 DIAGNOSIS — Z0189 Encounter for other specified special examinations: Secondary | ICD-10-CM

## 2015-10-18 DIAGNOSIS — Z01419 Encounter for gynecological examination (general) (routine) without abnormal findings: Secondary | ICD-10-CM | POA: Diagnosis not present

## 2015-10-18 DIAGNOSIS — Z1151 Encounter for screening for human papillomavirus (HPV): Secondary | ICD-10-CM | POA: Insufficient documentation

## 2015-10-18 DIAGNOSIS — Z1212 Encounter for screening for malignant neoplasm of rectum: Secondary | ICD-10-CM | POA: Diagnosis not present

## 2015-10-18 DIAGNOSIS — Z13220 Encounter for screening for lipoid disorders: Secondary | ICD-10-CM

## 2015-10-18 DIAGNOSIS — R52 Pain, unspecified: Secondary | ICD-10-CM

## 2015-10-18 DIAGNOSIS — Z1231 Encounter for screening mammogram for malignant neoplasm of breast: Secondary | ICD-10-CM

## 2015-10-18 DIAGNOSIS — Z1329 Encounter for screening for other suspected endocrine disorder: Secondary | ICD-10-CM

## 2015-10-18 DIAGNOSIS — Z1321 Encounter for screening for nutritional disorder: Secondary | ICD-10-CM

## 2015-10-18 HISTORY — DX: Pain, unspecified: R52

## 2015-10-18 LAB — HEMOCCULT GUIAC POC 1CARD (OFFICE): FECAL OCCULT BLD: NEGATIVE

## 2015-10-18 NOTE — Patient Instructions (Signed)
Physical in 1 year Mammogram 12/12 at 5 pm Labs in near future  Pap in 3 years if normal

## 2015-10-18 NOTE — Progress Notes (Signed)
Patient ID: Mary Rivera, female   DOB: 19-May-1971, 44 y.o.   MRN: [id number] History of Present Illness: Chenika is a 44 year old white female, married in for a well woman gyn exam and pap.She complains of pain in right knee at times and sometimes right shoulder and hands. Periods still regular. PCP is Mickie Hillier.  Current Medications, Allergies, Past Medical History, Past Surgical History, Family History and Social History were reviewed in [employer] record.     Review of Systems: Patient denies any headaches, hearing loss, fatigue, blurred vision, shortness of breath, chest pain, abdominal pain, problems with bowel movements, urination, or intercourse. No mood swings.See HPI for positives.    Physical Exam:BP 110/88 mmHg  Pulse 78  Ht 5' 2.25" (1.581 m)  Wt 181 lb 8 oz (82.328 kg)  BMI 32.94 kg/m2  LMP 09/23/2015 General:  Well developed, well nourished, no acute distress Skin:  Warm and dry Neck:  Midline trachea, normal thyroid, good ROM, no lymphadenopathy Lungs; Clear to auscultation bilaterally Breast:  No dominant palpable mass, retraction, or nipple discharge Cardiovascular: Regular rate and rhythm Abdomen:  Soft, non tender, no hepatosplenomegaly Pelvic:  External genitalia is normal in appearance, no lesions.  The vagina is normal in appearance. Urethra has no lesions or masses. The cervix is bulbous and everted at os,pap with HPV performed.  Uterus is felt to be normal size, shape, and contour.  No adnexal masses or tenderness noted.Bladder is non tender, no masses felt. Rectal: Good sphincter tone, no polyps, or hemorrhoids felt.  Hemoccult negative. Extremities/musculoskeletal:  No swelling or varicosities noted, no clubbing or cyanosis Psych:  No mood changes, alert and cooperative,seems happy   Impression: Well woman gyn exam with pap Body aches   Plan: Mammogram scheduled for 12/12 at 5 pm  Physical in 1 year, pap in 3 years if normal  with negative HPV Labs in near future Call if wants referral for knee  Get flu shot

## 2015-10-19 NOTE — Telephone Encounter (Signed)
Spoke with pt. Pt wants to have labs on 11/05/15. Orders put in system with 12/23 on orders. [place]

## 2015-10-20 ENCOUNTER — Telehealth: Payer: Self-pay | Admitting: Adult Health

## 2015-10-20 LAB — CYTOLOGY - PAP

## 2015-10-20 NOTE — Telephone Encounter (Signed)
Pt aware that pap was normal but was+HPV,reapeat pap in 1 year

## 2015-10-25 ENCOUNTER — Ambulatory Visit (HOSPITAL_COMMUNITY)
Admission: RE | Admit: 2015-10-25 | Discharge: 2015-10-25 | Disposition: A | Discharge status: Home / Self Care | Payer: BLUE CROSS/BLUE SHIELD | Source: Ambulatory Visit | Attending: Adult Health | Admitting: Adult Health

## 2015-10-25 DIAGNOSIS — Z1231 Encounter for screening mammogram for malignant neoplasm of breast: Secondary | ICD-10-CM | POA: Insufficient documentation

## 2015-11-12 LAB — COMPREHENSIVE METABOLIC PANEL
ALT: 10 IU/L (ref 0–32)
AST: 10 IU/L (ref 0–40)
Albumin/Globulin Ratio: 1.7 (ref 1.1–2.5)
Albumin: 4.6 g/dL (ref 3.5–5.5)
Alkaline Phosphatase: 55 IU/L (ref 39–117)
BUN/Creatinine Ratio: 16 (ref 9–23)
BUN: 10 mg/dL (ref 6–24)
Bilirubin Total: 0.4 mg/dL (ref 0.0–1.2)
CO2: 24 mmol/L (ref 18–29)
CREATININE: 0.63 mg/dL (ref 0.57–1.00)
Calcium: 9.4 mg/dL (ref 8.7–10.2)
Chloride: 102 mmol/L (ref 96–106)
GFR calc Af Amer: 127 mL/min/{1.73_m2} (ref >59)
GFR calc non Af Amer: 110 mL/min/{1.73_m2} (ref >59)
Globulin, Total: 2.7 g/dL (ref 1.5–4.5)
Glucose: 99 mg/dL (ref 65–99)
POTASSIUM: 4.3 mmol/L (ref 3.5–5.2)
Sodium: 140 mmol/L (ref 134–144)
Total Protein: 7.3 g/dL (ref 6.0–8.5)

## 2015-11-12 LAB — VITAMIN D 1,25 DIHYDROXY
VITAMIN D3 1, 25 (OH): 56 pg/mL
Vitamin D 1, 25 (OH)2 Total: 56 pg/mL
Vitamin D2 1, 25 (OH)2: <10 pg/mL

## 2015-11-12 LAB — TSH: TSH: 2.04 u[IU]/mL (ref 0.450–4.500)

## 2015-11-12 LAB — LIPID PANEL
Chol/HDL Ratio: 4.2 ratio units (ref 0.0–4.4)
Cholesterol, Total: 229 mg/dL — ABNORMAL HIGH (ref 100–199)
HDL: 54 mg/dL (ref >39)
LDL CALC: 156 mg/dL — AB (ref 0–99)
TRIGLYCERIDES: 94 mg/dL (ref 0–149)
VLDL Cholesterol Cal: 19 mg/dL (ref 5–40)

## 2015-11-12 LAB — CBC
Hematocrit: 43.9 % (ref 34.0–46.6)
Hemoglobin: 14.3 g/dL (ref 11.1–15.9)
MCH: 29.7 pg (ref 26.6–33.0)
MCHC: 32.6 g/dL (ref 31.5–35.7)
MCV: 91 fL (ref 79–97)
PLATELETS: 444 10*3/uL — AB (ref 150–379)
RBC: 4.82 x10E6/uL (ref 3.77–5.28)
RDW: 13.4 % (ref 12.3–15.4)
WBC: 6.2 10*3/uL (ref 3.4–10.8)

## 2015-11-13 ENCOUNTER — Telehealth: Payer: Self-pay | Admitting: Adult Health

## 2015-11-13 NOTE — Telephone Encounter (Signed)
Pt aware of labs, watch diet and increase exercise

## 2016-08-29 ENCOUNTER — Other Ambulatory Visit: Payer: Self-pay | Admitting: Adult Health

## 2016-08-29 DIAGNOSIS — Z1231 Encounter for screening mammogram for malignant neoplasm of breast: Secondary | ICD-10-CM

## 2016-10-19 ENCOUNTER — Other Ambulatory Visit (HOSPITAL_COMMUNITY)
Admission: RE | Admit: 2016-10-19 | Discharge: 2016-10-19 | Disposition: A | Discharge status: Home / Self Care | Payer: BLUE CROSS/BLUE SHIELD | Source: Ambulatory Visit | Attending: Adult Health | Admitting: Adult Health

## 2016-10-19 ENCOUNTER — Encounter: Payer: Self-pay | Admitting: Adult Health

## 2016-10-19 ENCOUNTER — Ambulatory Visit (INDEPENDENT_AMBULATORY_CARE_PROVIDER_SITE_OTHER): Payer: BLUE CROSS/BLUE SHIELD | Admitting: Adult Health

## 2016-10-19 VITALS — BP 128/90 | HR 80 | Ht 63.0 in | Wt 193.0 lb

## 2016-10-19 DIAGNOSIS — Z113 Encounter for screening for infections with a predominantly sexual mode of transmission: Secondary | ICD-10-CM | POA: Diagnosis not present

## 2016-10-19 DIAGNOSIS — Z01411 Encounter for gynecological examination (general) (routine) with abnormal findings: Secondary | ICD-10-CM | POA: Diagnosis not present

## 2016-10-19 DIAGNOSIS — Z1151 Encounter for screening for human papillomavirus (HPV): Secondary | ICD-10-CM | POA: Diagnosis not present

## 2016-10-19 DIAGNOSIS — R87810 Cervical high risk human papillomavirus (HPV) DNA test positive: Secondary | ICD-10-CM

## 2016-10-19 DIAGNOSIS — Z1211 Encounter for screening for malignant neoplasm of colon: Secondary | ICD-10-CM | POA: Insufficient documentation

## 2016-10-19 DIAGNOSIS — Z1212 Encounter for screening for malignant neoplasm of rectum: Secondary | ICD-10-CM

## 2016-10-19 DIAGNOSIS — Z01419 Encounter for gynecological examination (general) (routine) without abnormal findings: Secondary | ICD-10-CM

## 2016-10-19 DIAGNOSIS — R87610 Atypical squamous cells of undetermined significance on cytologic smear of cervix (ASC-US): Secondary | ICD-10-CM | POA: Diagnosis not present

## 2016-10-19 DIAGNOSIS — F419 Anxiety disorder, unspecified: Secondary | ICD-10-CM

## 2016-10-19 HISTORY — DX: Cervical high risk human papillomavirus (HPV) DNA test positive: R87.810

## 2016-10-19 LAB — HEMOCCULT GUIAC POC 1CARD (OFFICE): Fecal Occult Blood, POC: NEGATIVE

## 2016-10-19 MED ORDER — ESCITALOPRAM OXALATE 10 MG PO TABS: 10.0000 mg | ORAL_TABLET | Freq: Every day | ORAL | 11 refills | Status: DC

## 2016-10-19 NOTE — Progress Notes (Signed)
Patient ID: Mary Rivera, female   DOB: 01-03-1971, 45 y.o.   MRN: [id number] History of Present Illness: Mary Rivera is a 45 year old white female in for well woman gyn exam and pap, she had +HPV on pap 10/18/15. PCP is Dr Mickie Hillier.    Current Medications, Allergies, Past Medical History, Past Surgical History, Family History and Social History were reviewed in [employer] record.     Review of Systems: Patient denies any headaches, hearing loss, fatigue, blurred vision, shortness of breath, chest pain, abdominal pain, problems with bowel movements, urination, or intercourse. No mood swings.Has pain in right knee and back at times.Feels more anxiety recently and requests to restart lexapro.    Physical Exam:BP 128/90 (BP Location: Left Arm, Patient Position: Sitting, Cuff Size: Normal)   Pulse 80   Ht 5\' 3"  (1.6 m)   Wt 193 lb (87.5 kg)   LMP 09/30/2016 (Approximate)   BMI 34.19 kg/m  General:  Well developed, well nourished, no acute distress Skin:  Warm and dry Neck:  Midline trachea, normal thyroid, good ROM, no lymphadenopathy Lungs; Clear to auscultation bilaterally Breast:  No dominant palpable mass, retraction, or nipple discharge Cardiovascular: Regular rate and rhythm Abdomen:  Soft, non tender, no hepatosplenomegaly Pelvic:  External genitalia is normal in appearance, no lesions.  The vagina is normal in appearance. Urethra has no lesions or masses. The cervix is bulbous.Pap with HPV and GC/CHL performed.  Uterus is felt to be normal size, shape, and contour.  No adnexal masses or tenderness noted.Bladder is non tender, no masses felt. Rectal: Good sphincter tone, no polyps, or hemorrhoids felt.  Hemoccult negative. Extremities/musculoskeletal:  No swelling or varicosities noted, no clubbing or cyanosis Psych:  No mood changes, alert and cooperative,seems happy PHQ 2 score 1, but has anxiety esp at work.  Impression: 1. Encounter for gynecological  examination with Papanicolaou smear of cervix   2. Anxiety   3. Cervical high risk HPV (human papillomavirus) test positive       Plan: Meds ordered this encounter  Medications  . escitalopram (LEXAPRO) 10 MG tablet    Sig: Take 1 tablet (10 mg total) by mouth daily.    Dispense:  30 tablet    Refill:  11    Order Specific Question:   Supervising Provider    Answer:   Tania Ade H [[id number]]   Check CBC,CMP,TSH and lipids,A1c and vitamin D Physical in 1 year Pap in 3 if normal Mammogram 12/18 and yearly

## 2016-10-19 NOTE — Patient Instructions (Signed)
Physical in 1 year, pap in 3 if normal Mammogram yearly Start lexapro

## 2016-10-24 LAB — CYTOLOGY - PAP
Chlamydia: NEGATIVE
Diagnosis: UNDETERMINED — AB
HPV: DETECTED — AB
Neisseria Gonorrhea: NEGATIVE

## 2016-10-25 ENCOUNTER — Telehealth: Payer: Self-pay | Admitting: Adult Health

## 2016-10-25 ENCOUNTER — Encounter: Payer: Self-pay | Admitting: Adult Health

## 2016-10-25 DIAGNOSIS — R87610 Atypical squamous cells of undetermined significance on cytologic smear of cervix (ASC-US): Secondary | ICD-10-CM | POA: Insufficient documentation

## 2016-10-25 DIAGNOSIS — R87810 Cervical high risk human papillomavirus (HPV) DNA test positive: Principal | ICD-10-CM

## 2016-10-25 HISTORY — DX: Atypical squamous cells of undetermined significance on cytologic smear of cervix (ASC-US): R87.610

## 2016-10-25 NOTE — Telephone Encounter (Signed)
Pt aware that was abnormal, ASCUS with +HPV, to make colpo appt.

## 2016-10-30 ENCOUNTER — Ambulatory Visit (HOSPITAL_COMMUNITY)
Admission: RE | Admit: 2016-10-30 | Discharge: 2016-10-30 | Disposition: A | Discharge status: Home / Self Care | Payer: BLUE CROSS/BLUE SHIELD | Source: Ambulatory Visit | Attending: Adult Health | Admitting: Adult Health

## 2016-10-30 DIAGNOSIS — Z1231 Encounter for screening mammogram for malignant neoplasm of breast: Secondary | ICD-10-CM | POA: Insufficient documentation

## 2016-11-01 ENCOUNTER — Encounter: Payer: BLUE CROSS/BLUE SHIELD | Admitting: Obstetrics and Gynecology

## 2016-11-03 ENCOUNTER — Other Ambulatory Visit: Payer: Self-pay | Admitting: Obstetrics and Gynecology

## 2016-11-03 ENCOUNTER — Ambulatory Visit (INDEPENDENT_AMBULATORY_CARE_PROVIDER_SITE_OTHER): Payer: BLUE CROSS/BLUE SHIELD | Admitting: Obstetrics and Gynecology

## 2016-11-03 ENCOUNTER — Encounter: Payer: Self-pay | Admitting: Obstetrics and Gynecology

## 2016-11-03 VITALS — BP 124/78 | HR 72 | Ht 63.0 in | Wt 191.8 lb

## 2016-11-03 DIAGNOSIS — N870 Mild cervical dysplasia: Secondary | ICD-10-CM

## 2016-11-03 DIAGNOSIS — Z01419 Encounter for gynecological examination (general) (routine) without abnormal findings: Secondary | ICD-10-CM | POA: Diagnosis not present

## 2016-11-03 DIAGNOSIS — R87610 Atypical squamous cells of undetermined significance on cytologic smear of cervix (ASC-US): Secondary | ICD-10-CM

## 2016-11-03 DIAGNOSIS — R87810 Cervical high risk human papillomavirus (HPV) DNA test positive: Secondary | ICD-10-CM | POA: Diagnosis not present

## 2016-11-03 DIAGNOSIS — Z3202 Encounter for pregnancy test, result negative: Secondary | ICD-10-CM | POA: Diagnosis not present

## 2016-11-03 LAB — POCT URINE PREGNANCY: Preg Test, Ur: NEGATIVE

## 2016-11-03 NOTE — Progress Notes (Signed)
  Carol Dunkerson 45 y.o. G3P3 here for colposcopy for ASCUS with POSITIVE  HPV pap smear on 10/25/2016. Discussed role for HPV in cervical dysplasia, need for surveillance.  Patient given informed consent, signed copy in the chart, time out was performed.  Placed in lithotomy position. Cervix viewed with speculum and colposcope after application of acetic acid.   Colposcopy adequate? Yes mosaicism noted at 11 o'clock; biopsies obtained at 11 o'clock.  All specimens were labelled and sent to pathology.   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 Will follow up pathology and manage accordingly.  Routine preventative health maintenance measures emphasized.    By signing my name below, I, Evelene Croon, attest that this documentation has been prepared under the direction and in the presence of Jonnie Kind, MD . Electronically Signed: Evelene Croon, Scribe. 11/03/2016. 11:01 AM. I personally performed the services described in this documentation, which was SCRIBED in my presence. The recorded information has been reviewed and considered accurate. It has been edited as necessary during review. Jonnie Kind, MD

## 2016-11-04 LAB — CBC
Hematocrit: 40.1 % (ref 34.0–46.6)
Hemoglobin: 13.2 g/dL (ref 11.1–15.9)
MCH: 29.3 pg (ref 26.6–33.0)
MCHC: 32.9 g/dL (ref 31.5–35.7)
MCV: 89 fL (ref 79–97)
PLATELETS: 445 10*3/uL — AB (ref 150–379)
RBC: 4.51 x10E6/uL (ref 3.77–5.28)
RDW: 13.5 % (ref 12.3–15.4)
WBC: 7.7 10*3/uL (ref 3.4–10.8)

## 2016-11-04 LAB — COMPREHENSIVE METABOLIC PANEL
A/G RATIO: 1.7 (ref 1.2–2.2)
ALT: 13 IU/L (ref 0–32)
AST: 11 IU/L (ref 0–40)
Albumin: 4.6 g/dL (ref 3.5–5.5)
Alkaline Phosphatase: 63 IU/L (ref 39–117)
BUN/Creatinine Ratio: 9 (ref 9–23)
BUN: 6 mg/dL (ref 6–24)
Bilirubin Total: 0.4 mg/dL (ref 0.0–1.2)
CO2: 25 mmol/L (ref 18–29)
CREATININE: 0.66 mg/dL (ref 0.57–1.00)
Calcium: 9.4 mg/dL (ref 8.7–10.2)
Chloride: 100 mmol/L (ref 96–106)
GFR, EST AFRICAN AMERICAN: 124 mL/min/{1.73_m2} (ref >59)
GFR, EST NON AFRICAN AMERICAN: 108 mL/min/{1.73_m2} (ref >59)
GLUCOSE: 98 mg/dL (ref 65–99)
Globulin, Total: 2.7 g/dL (ref 1.5–4.5)
POTASSIUM: 4.4 mmol/L (ref 3.5–5.2)
Sodium: 140 mmol/L (ref 134–144)
TOTAL PROTEIN: 7.3 g/dL (ref 6.0–8.5)

## 2016-11-04 LAB — LIPID PANEL
CHOL/HDL RATIO: 4.3 ratio (ref 0.0–4.4)
CHOLESTEROL TOTAL: 196 mg/dL (ref 100–199)
HDL: 46 mg/dL (ref >39)
LDL CALC: 121 mg/dL — AB (ref 0–99)
TRIGLYCERIDES: 146 mg/dL (ref 0–149)
VLDL CHOLESTEROL CAL: 29 mg/dL (ref 5–40)

## 2016-11-04 LAB — HEMOGLOBIN A1C
Est. average glucose Bld gHb Est-mCnc: 108 mg/dL
Hgb A1c MFr Bld: 5.4 % (ref 4.8–5.6)

## 2016-11-04 LAB — TSH: TSH: 2.23 u[IU]/mL (ref 0.450–4.500)

## 2016-11-04 LAB — VITAMIN D 25 HYDROXY (VIT D DEFICIENCY, FRACTURES): VIT D 25 HYDROXY: 25.8 ng/mL — AB (ref 30.0–100.0)

## 2016-11-09 ENCOUNTER — Telehealth: Payer: Self-pay | Admitting: Obstetrics and Gynecology

## 2016-11-09 NOTE — Telephone Encounter (Signed)
Results from blood work reviewed with patient. She also wanted to know biopsy results which were not resulted. I advised patient that with the holiday, they may be a little behind.

## 2016-11-16 ENCOUNTER — Telehealth: Payer: Self-pay | Admitting: Obstetrics and Gynecology

## 2016-11-16 NOTE — Telephone Encounter (Signed)
Pt made aware that she needs pap and cotesting annually til normal x 2 yrs., then routine f/u okay

## 2016-11-16 NOTE — Telephone Encounter (Signed)
Patient called wanting results of cervical biopsy done on 12/22 but I cannot find the results. Please advise.

## 2017-11-04 ENCOUNTER — Other Ambulatory Visit: Payer: Self-pay | Admitting: Adult Health

## 2017-11-22 ENCOUNTER — Ambulatory Visit (INDEPENDENT_AMBULATORY_CARE_PROVIDER_SITE_OTHER): Payer: BLUE CROSS/BLUE SHIELD | Admitting: Adult Health

## 2017-11-22 ENCOUNTER — Other Ambulatory Visit (HOSPITAL_COMMUNITY)
Admission: RE | Admit: 2017-11-22 | Discharge: 2017-11-22 | Disposition: A | Discharge status: Home / Self Care | Payer: BLUE CROSS/BLUE SHIELD | Source: Ambulatory Visit | Attending: Adult Health | Admitting: Adult Health

## 2017-11-22 ENCOUNTER — Encounter: Payer: Self-pay | Admitting: Adult Health

## 2017-11-22 VITALS — BP 120/72 | HR 82 | Ht 62.5 in | Wt 201.0 lb

## 2017-11-22 DIAGNOSIS — Z01411 Encounter for gynecological examination (general) (routine) with abnormal findings: Secondary | ICD-10-CM | POA: Diagnosis not present

## 2017-11-22 DIAGNOSIS — Z01419 Encounter for gynecological examination (general) (routine) without abnormal findings: Secondary | ICD-10-CM | POA: Diagnosis not present

## 2017-11-22 DIAGNOSIS — Z1211 Encounter for screening for malignant neoplasm of colon: Secondary | ICD-10-CM

## 2017-11-22 DIAGNOSIS — Z1212 Encounter for screening for malignant neoplasm of rectum: Secondary | ICD-10-CM | POA: Insufficient documentation

## 2017-11-22 DIAGNOSIS — F419 Anxiety disorder, unspecified: Secondary | ICD-10-CM

## 2017-11-22 DIAGNOSIS — Z8742 Personal history of other diseases of the female genital tract: Secondary | ICD-10-CM | POA: Diagnosis not present

## 2017-11-22 LAB — HEMOCCULT GUIAC POC 1CARD (OFFICE): FECAL OCCULT BLD: NEGATIVE

## 2017-11-22 MED ORDER — ESCITALOPRAM OXALATE 10 MG PO TABS: 10.0000 mg | ORAL_TABLET | Freq: Every day | ORAL | 4 refills | Status: DC

## 2017-11-22 NOTE — Progress Notes (Signed)
Patient ID: Mary Rivera, female   DOB: 18-Dec-1970, 47 y.o.   MRN: [id number] History of Present Illness: Mary Rivera is a 47 year old white female, married in for well woman gyn exam and pap. PCP is Dr Mickie Hillier.    Current Medications, Allergies, Past Medical History, Past Surgical History, Family History and Social History were reviewed in [facility] record.     Review of Systems: Patient denies any headaches, hearing loss, fatigue, blurred vision, shortness of breath, chest pain, abdominal pain, problems with bowel movements, urination, or intercourse. No joint pain or mood swings.    Physical Exam:BP 120/72 (BP Location: Left Arm, Patient Position: Sitting, Cuff Size: Large)   Pulse 82   Ht 5' 2.5" (1.588 m)   Wt 201 lb (91.2 kg)   LMP 11/15/2017   BMI 36.18 kg/m  General:  Well developed, well nourished, no acute distress Skin:  Warm and dry Neck:  Midline trachea, normal thyroid, good ROM, no lymphadenopathy Lungs; Clear to auscultation bilaterally Breast:  No dominant palpable mass, retraction, or nipple discharge Cardiovascular: Regular rate and rhythm Abdomen:  Soft, non tender, no hepatosplenomegaly Pelvic:  External genitalia is normal in appearance, no lesions.  The vagina is normal in appearance. Urethra has no lesions or masses. The cervix is bulbous, everted at os and friable with EC brush, pap with HPV and GC/CHL performed.  Uterus is felt to be normal size, shape, and contour.  No adnexal masses or tenderness noted.Bladder is non tender, no masses felt. Rectal: Good sphincter tone, no polyps, or hemorrhoids felt.  Hemoccult negative. Extremities/musculoskeletal:  No swelling or varicosities noted, no clubbing or cyanosis Psych:  No mood changes, alert and cooperative,seems happy PHQ 2 score 0.  Impression: 1. Encounter for gynecological examination with Papanicolaou smear of cervix   2. History of abnormal cervical Pap smear   3. Screening  for colorectal cancer   4. Anxiety       Plan: Meds ordered this encounter  Medications  . escitalopram (LEXAPRO) 10 MG tablet    Sig: Take 1 tablet (10 mg total) by mouth daily.    Dispense:  90 tablet    Refill:  4    Order Specific Question:   Supervising Provider    Answer:   Florian Buff [[id number]]  Physical in 1 year Pap in 3 if normal Mammogram yearly Labs with PCP

## 2017-11-27 LAB — CYTOLOGY - PAP
Chlamydia: NEGATIVE
Diagnosis: NEGATIVE
HPV: NOT DETECTED
Neisseria Gonorrhea: NEGATIVE

## 2018-02-07 DIAGNOSIS — J329 Chronic sinusitis, unspecified: Secondary | ICD-10-CM | POA: Diagnosis not present

## 2018-09-25 DIAGNOSIS — H6123 Impacted cerumen, bilateral: Secondary | ICD-10-CM | POA: Diagnosis not present

## 2018-09-25 DIAGNOSIS — H6120 Impacted cerumen, unspecified ear: Secondary | ICD-10-CM | POA: Diagnosis not present

## 2019-01-21 ENCOUNTER — Ambulatory Visit: Payer: BLUE CROSS/BLUE SHIELD | Admitting: Adult Health

## 2019-01-22 ENCOUNTER — Other Ambulatory Visit: Payer: Self-pay

## 2019-01-22 ENCOUNTER — Ambulatory Visit (INDEPENDENT_AMBULATORY_CARE_PROVIDER_SITE_OTHER): Payer: BLUE CROSS/BLUE SHIELD | Admitting: Adult Health

## 2019-01-22 ENCOUNTER — Encounter: Payer: Self-pay | Admitting: Adult Health

## 2019-01-22 VITALS — BP 129/90 | HR 80 | Ht 62.2 in | Wt 192.0 lb

## 2019-01-22 DIAGNOSIS — Z1211 Encounter for screening for malignant neoplasm of colon: Secondary | ICD-10-CM | POA: Diagnosis not present

## 2019-01-22 DIAGNOSIS — Z1212 Encounter for screening for malignant neoplasm of rectum: Secondary | ICD-10-CM

## 2019-01-22 DIAGNOSIS — Z01419 Encounter for gynecological examination (general) (routine) without abnormal findings: Secondary | ICD-10-CM

## 2019-01-22 DIAGNOSIS — Z13220 Encounter for screening for lipoid disorders: Secondary | ICD-10-CM

## 2019-01-22 DIAGNOSIS — N951 Menopausal and female climacteric states: Secondary | ICD-10-CM

## 2019-01-22 LAB — HEMOCCULT GUIAC POC 1CARD (OFFICE): FECAL OCCULT BLD: NEGATIVE

## 2019-01-22 MED ORDER — ESCITALOPRAM OXALATE 10 MG PO TABS: 10.0000 mg | ORAL_TABLET | Freq: Every day | ORAL | 4 refills | Status: DC

## 2019-01-22 NOTE — Patient Instructions (Signed)
Menopause  Menopause is the normal time of life when menstrual periods stop completely. It is usually confirmed by 12 months without a menstrual period. The transition to menopause (perimenopause) most often happens between the ages of 45 and 55. During perimenopause, hormone levels change in your body, which can cause symptoms and affect your health. Menopause may increase your risk for:   Loss of bone (osteoporosis), which causes bone breaks (fractures).   Depression.   Hardening and narrowing of the arteries (atherosclerosis), which can cause heart attacks and strokes.  What are the causes?  This condition is usually caused by a natural change in hormone levels that happens as you get older. The condition may also be caused by surgery to remove both ovaries (bilateral oophorectomy).  What increases the risk?  This condition is more likely to start at an earlier age if you have certain medical conditions or treatments, including:   A tumor of the pituitary gland in the brain.   A disease that affects the ovaries and hormone production.   Radiation treatment for cancer.   Certain cancer treatments, such as chemotherapy or hormone (anti-estrogen) therapy.   Heavy smoking and excessive alcohol use.   Family history of early menopause.  This condition is also more likely to develop earlier in women who are very thin.  What are the signs or symptoms?  Symptoms of this condition include:   Hot flashes.   Irregular menstrual periods.   Night sweats.   Changes in feelings about sex. This could be a decrease in sex drive or an increased comfort around your sexuality.   Vaginal dryness and thinning of the vaginal walls. This may cause painful intercourse.   Dryness of the skin and development of wrinkles.   Headaches.   Problems sleeping (insomnia).   Mood swings or irritability.   Memory problems.   Weight gain.   Hair growth on the face and chest.   Bladder infections or problems with urinating.  How  is this diagnosed?  This condition is diagnosed based on your medical history, a physical exam, your age, your menstrual history, and your symptoms. Hormone tests may also be done.  How is this treated?  In some cases, no treatment is needed. You and your health care provider should make a decision together about whether treatment is necessary. Treatment will be based on your individual condition and preferences. Treatment for this condition focuses on managing symptoms. Treatment may include:   Menopausal hormone therapy (MHT).   Medicines to treat specific symptoms or complications.   Acupuncture.   Vitamin or herbal supplements.  Before starting treatment, make sure to let your health care provider know if you have a personal or family history of:   Heart disease.   Breast cancer.   Blood clots.   Diabetes.   Osteoporosis.  Follow these instructions at home:  Lifestyle   Do not use any products that contain nicotine or tobacco, such as cigarettes and e-cigarettes. If you need help quitting, ask your health care provider.   Get at least 30 minutes of physical activity on 5 or more days each week.   Avoid alcoholic and caffeinated beverages, as well as spicy foods. This may help prevent hot flashes.   Get 7-8 hours of sleep each night.   If you have hot flashes, try:  ? Dressing in layers.  ? Avoiding things that may trigger hot flashes, such as spicy food, warm places, or stress.  ? Taking slow, deep   breaths when a hot flash starts.  ? Keeping a fan in your home and office.   Find ways to manage stress, such as deep breathing, meditation, or journaling.   Consider going to group therapy with other women who are having menopause symptoms. Ask your health care provider about recommended group therapy meetings.  Eating and drinking   Eat a healthy, balanced diet that contains whole grains, lean protein, low-fat dairy, and plenty of fruits and vegetables.   Your health care provider may recommend  adding more soy to your diet. Foods that contain soy include tofu, tempeh, and soy milk.   Eat plenty of foods that contain calcium and vitamin D for bone health. Items that are rich in calcium include low-fat milk, yogurt, beans, almonds, sardines, broccoli, and kale.  Medicines   Take over-the-counter and prescription medicines only as told by your health care provider.   Talk with your health care provider before starting any herbal supplements. If prescribed, take vitamins and supplements as told by your health care provider. These may include:  ? Calcium. Women age 51 and older should get 1,200 mg (milligrams) of calcium every day.  ? Vitamin D. Women need 600-800 International Units of vitamin D each day.  ? Vitamins B12 and B6. Aim for 50 micrograms of B12 and 1.5 mg of B6 each day.  General instructions   Keep track of your menstrual periods, including:  ? When they occur.  ? How heavy they are and how long they last.  ? How much time passes between periods.   Keep track of your symptoms, noting when they start, how often you have them, and how long they last.   Use vaginal lubricants or moisturizers to help with vaginal dryness and improve comfort during sex.   Keep all follow-up visits as told by your health care provider. This is important. This includes any group therapy or counseling.  Contact a health care provider if:   You are still having menstrual periods after age 55.   You have pain during sex.   You have not had a period for 12 months and you develop vaginal bleeding.  Get help right away if:   You have:  ? Severe depression.  ? Excessive vaginal bleeding.  ? Pain when you urinate.  ? A fast or irregular heart beat (palpitations).  ? Severe headaches.  ? Abdomen (abdominal) pain or severe indigestion.   You fell and you think you have a broken bone.   You develop leg or chest pain.   You develop vision problems.   You feel a lump in your breast.  Summary   Menopause is the normal  time of life when menstrual periods stop completely. It is usually confirmed by 12 months without a menstrual period.   The transition to menopause (perimenopause) most often happens between the ages of 45 and 55.   Symptoms can be managed through medicines, lifestyle changes, and complementary therapies such as acupuncture.   Eat a balanced diet that is rich in nutrients to promote bone health and heart health and to manage symptoms during menopause.  This information is not intended to replace advice given to you by your health care provider. Make sure you discuss any questions you have with your health care provider.  Document Released: 01/20/2004 Document Revised: 12/02/2016 Document Reviewed: 12/02/2016  Elsevier Interactive Patient Education  2019 Elsevier Inc[employer]

## 2019-01-22 NOTE — Progress Notes (Signed)
Patient ID: Mary Rivera, female   DOB: Sep 26, 1971, 48 y.o.   MRN: [id number] History of Present Illness: Mary Rivera is a 48 year old white female, married in for a well woman gyn exam,she had a normal pap with negative HPV 11/22/17. She works at [employer] PCP is Dr Mickie Hillier.    Current Medications, Allergies, Past Medical History, Past Surgical History, Family History and Social History were reviewed in [facility] record.     Review of Systems: Patient denies any headaches, hearing loss, fatigue, blurred vision, shortness of breath, chest pain, abdominal pain, problems with bowel movements, urination, or intercourse. No joint pain or mood swings. Periods beginning to vary, has cramps at times.  Hot flashes and night sweats  Stress eats some days.    Physical Exam:BP 129/90 (BP Location: Left Arm, Patient Position: Sitting, Cuff Size: Normal)   Pulse 80   Ht 5' 2.2" (1.58 m)   Wt 192 lb (87.1 kg)   LMP 01/13/2019   BMI 34.89 kg/m  General:  Well developed, well nourished, no acute distress Skin:  Warm and dry Neck:  Midline trachea, normal thyroid, good ROM, no lymphadenopathy Lungs; Clear to auscultation bilaterally Breast:  No dominant palpable mass, retraction, or nipple discharge Cardiovascular: Regular rate and rhythm Abdomen:  Soft, non tender, no hepatosplenomegaly Pelvic:  External genitalia is normal in appearance, no lesions.  The vagina is normal in appearance. Urethra has no lesions or masses. The cervix is bulbous.  Uterus is felt to be normal size, shape, and contour.  No adnexal masses or tenderness noted.Bladder is non tender, no masses felt. Rectal: Good sphincter tone, no polyps, or hemorrhoids felt.  Hemoccult negative. Extremities/musculoskeletal:  No swelling or varicosities noted, no clubbing or cyanosis Psych:  No mood changes, alert and cooperative,seems happy Fall risk is low. PHQ 9 score is 3, she was on lexapro, but has been off  since before Christmas, will refill today Examination chaperoned by Diona Fanti CMA.   Impression: 1. Encounter for well woman exam with routine gynecological exam   2. Screening for colorectal cancer   3. Perimenopause       Plan: Physical in 1 year Pap in 2022 Mammogram due  Check CBC,CMP,TSH and lipids Meds ordered this encounter  Medications  . escitalopram (LEXAPRO) 10 MG tablet    Sig: Take 1 tablet (10 mg total) by mouth daily.    Dispense:  90 tablet    Refill:  4    Order Specific Question:   Supervising Provider    Answer:   Florian Buff [[id number]]  Review handout on menopause

## 2019-01-25 DIAGNOSIS — Z13220 Encounter for screening for lipoid disorders: Secondary | ICD-10-CM | POA: Diagnosis not present

## 2019-01-25 DIAGNOSIS — Z01419 Encounter for gynecological examination (general) (routine) without abnormal findings: Secondary | ICD-10-CM | POA: Diagnosis not present

## 2019-01-26 LAB — COMPREHENSIVE METABOLIC PANEL
A/G RATIO: 1.7 (ref 1.2–2.2)
ALT: 11 IU/L (ref 0–32)
AST: 11 IU/L (ref 0–40)
Albumin: 4.5 g/dL (ref 3.8–4.8)
Alkaline Phosphatase: 57 IU/L (ref 39–117)
BUN/Creatinine Ratio: 14 (ref 9–23)
BUN: 9 mg/dL (ref 6–24)
Bilirubin Total: 0.3 mg/dL (ref 0.0–1.2)
CALCIUM: 9.6 mg/dL (ref 8.7–10.2)
CO2: 21 mmol/L (ref 20–29)
Chloride: 103 mmol/L (ref 96–106)
Creatinine, Ser: 0.66 mg/dL (ref 0.57–1.00)
GFR, EST AFRICAN AMERICAN: 123 mL/min/{1.73_m2} (ref >59)
GFR, EST NON AFRICAN AMERICAN: 106 mL/min/{1.73_m2} (ref >59)
GLOBULIN, TOTAL: 2.6 g/dL (ref 1.5–4.5)
Glucose: 103 mg/dL — ABNORMAL HIGH (ref 65–99)
POTASSIUM: 4.5 mmol/L (ref 3.5–5.2)
SODIUM: 141 mmol/L (ref 134–144)
TOTAL PROTEIN: 7.1 g/dL (ref 6.0–8.5)

## 2019-01-26 LAB — CBC
HEMATOCRIT: 39.1 % (ref 34.0–46.6)
HEMOGLOBIN: 13 g/dL (ref 11.1–15.9)
MCH: 29.3 pg (ref 26.6–33.0)
MCHC: 33.2 g/dL (ref 31.5–35.7)
MCV: 88 fL (ref 79–97)
Platelets: 430 10*3/uL (ref 150–450)
RBC: 4.44 x10E6/uL (ref 3.77–5.28)
RDW: 12.1 % (ref 11.7–15.4)
WBC: 6.7 10*3/uL (ref 3.4–10.8)

## 2019-01-26 LAB — LIPID PANEL
CHOLESTEROL TOTAL: 206 mg/dL — AB (ref 100–199)
Chol/HDL Ratio: 4.2 ratio (ref 0.0–4.4)
HDL: 49 mg/dL (ref >39)
LDL Calculated: 138 mg/dL — ABNORMAL HIGH (ref 0–99)
Triglycerides: 93 mg/dL (ref 0–149)
VLDL Cholesterol Cal: 19 mg/dL (ref 5–40)

## 2019-01-26 LAB — TSH: TSH: 2.09 u[IU]/mL (ref 0.450–4.500)

## 2019-12-17 ENCOUNTER — Encounter: Payer: Self-pay | Admitting: Family Medicine

## 2020-02-09 ENCOUNTER — Other Ambulatory Visit: Payer: Self-pay | Admitting: Adult Health

## 2020-05-20 ENCOUNTER — Encounter: Payer: Self-pay | Admitting: Adult Health

## 2020-05-20 ENCOUNTER — Ambulatory Visit (INDEPENDENT_AMBULATORY_CARE_PROVIDER_SITE_OTHER): Payer: BC Managed Care – PPO | Admitting: Adult Health

## 2020-05-20 ENCOUNTER — Other Ambulatory Visit: Payer: Self-pay

## 2020-05-20 VITALS — BP 129/86 | HR 74 | Ht 62.0 in | Wt 184.6 lb

## 2020-05-20 DIAGNOSIS — Z1211 Encounter for screening for malignant neoplasm of colon: Secondary | ICD-10-CM | POA: Diagnosis not present

## 2020-05-20 DIAGNOSIS — M25562 Pain in left knee: Secondary | ICD-10-CM | POA: Diagnosis not present

## 2020-05-20 DIAGNOSIS — Z01419 Encounter for gynecological examination (general) (routine) without abnormal findings: Secondary | ICD-10-CM

## 2020-05-20 LAB — HEMOCCULT GUIAC POC 1CARD (OFFICE): Fecal Occult Blood, POC: NEGATIVE

## 2020-05-20 NOTE — Progress Notes (Signed)
Patient ID: Mary Rivera, female   DOB: 1971-07-25, 49 y.o.   MRN: [id number] History of Present Illness: Mary Rivera is a 49 year old white female, married, [id number], in for well woman gyn exam.She had a normal pap with negative HPV 1/0/19.  She is still working at the daycare. PCP is Dr Wolfgang Phoenix   Current Medications, Allergies, Past Medical History, Past Surgical History, Family History and Social History were reviewed in [facility] record.     Review of Systems: Patient denies any headaches, hearing loss, fatigue, blurred vision, shortness of breath, chest pain, abdominal pain, problems with bowel movements, urination, or intercourse(does not have sex often). No mood swings. Has pain in left knee, aches esp at night has tried OTC meds, heat an wraps without relief Periods irregular last one April  +night sweats,can try estroven  Physical Exam:BP 129/86 (BP Location: Right Arm, Patient Position: Sitting, Cuff Size: Normal)   Pulse 74   Ht 5\' 2"  (1.575 m)   Wt 184 lb 9.6 oz (83.7 kg)   LMP 02/27/2020   BMI 33.76 kg/m  General:  Well developed, well nourished, no acute distress Skin:  Warm and dry Neck:  Midline trachea, normal thyroid, good ROM, no lymphadenopathy Lungs; Clear to auscultation bilaterally Breast:  No dominant palpable mass, retraction, or nipple discharge Cardiovascular: Regular rate and rhythm Abdomen:  Soft, non tender, no hepatosplenomegaly Pelvic:  External genitalia is normal in appearance, no lesions.  The vagina is normal in appearance. Urethra has no lesions or masses. The cervix is bulbous.  Uterus is felt to be normal size, shape, and contour.  No adnexal masses or tenderness noted.Bladder is non tender, no masses felt. Rectal: Good sphincter tone, no polyps, or hemorrhoids felt.  Hemoccult negative. Extremities/musculoskeletal:  No swelling or varicosities noted, no clubbing or cyanosis. Left knee is not swollen or red, no tenderness with  palpation. Psych:  No mood changes, alert and cooperative,seems happy Fall risk is low PHQ 2 score is 0. Is on lexapro,has refills  Examination chaperoned by Diona Fanti CMA.  Impression and Plan: 1. Encounter for well woman exam with routine gynecological exam Pap and physical in 1 year Get mammogram  Discussed she is perimenopausal  2. Encounter for screening fecal occult blood testing Colonoscopy at 51  3. Acute pain of left knee Referred to Dr Aline Brochure

## 2020-05-21 ENCOUNTER — Encounter: Payer: Self-pay | Admitting: Orthopedic Surgery

## 2020-06-08 ENCOUNTER — Ambulatory Visit: Payer: BC Managed Care – PPO

## 2020-06-08 ENCOUNTER — Encounter: Payer: Self-pay | Admitting: Orthopaedic Surgery

## 2020-06-08 ENCOUNTER — Other Ambulatory Visit: Payer: Self-pay

## 2020-06-08 ENCOUNTER — Ambulatory Visit (INDEPENDENT_AMBULATORY_CARE_PROVIDER_SITE_OTHER): Payer: BC Managed Care – PPO | Admitting: Orthopaedic Surgery

## 2020-06-08 VITALS — BP 137/87 | HR 79 | Ht 62.0 in | Wt 185.0 lb

## 2020-06-08 DIAGNOSIS — G8929 Other chronic pain: Secondary | ICD-10-CM | POA: Diagnosis not present

## 2020-06-08 DIAGNOSIS — M25562 Pain in left knee: Secondary | ICD-10-CM | POA: Diagnosis not present

## 2020-06-08 MED ORDER — NAPROXEN 500 MG PO TABS: 500.0000 mg | ORAL_TABLET | Freq: Two times a day (BID) | ORAL | 5 refills | Status: DC

## 2020-06-08 NOTE — Progress Notes (Signed)
Subjective:    Patient ID: Mary Rivera, female    DOB: 19-Jul-1971, 49 y.o.   MRN: [id number]  HPI She has been having pain in the left knee since April 2021.  She has swelling, popping and more recently locking of the knee at times.  She has no trauma, no redness, no numbness.  She has tried Tylenol, ice with minimal help.  She has been seen by primary care and referred here.  I have reviewed the notes.   Review of Systems  Constitutional: Positive for activity change.  Musculoskeletal: Positive for arthralgias, gait problem and joint swelling.  Neurological: Positive for headaches.  All other systems reviewed and are negative.  For Review of Systems, all other systems reviewed and are negative.  The following is a summary of the past history medically, past history surgically, known current medicines, social history and family history.  This information is gathered electronically by the computer from prior information and documentation.  I review this each visit and have found including this information at this point in the chart is beneficial and informative.   Past Medical History:  Diagnosis Date  . ASCUS with positive high risk HPV cervical 10/25/2016  . Body aches 10/18/2015  . Cervical high risk HPV (human papillomavirus) test positive 10/19/2016  . Depression   . Frequent headaches 10/08/2013   Usually has has around period  . Otitis media of left ear 10/15/2014  . Sinus infection 10/15/2014    Past Surgical History:  Procedure Laterality Date  . BREAST BIOPSY Left 12/11/2014   Procedure: LEFT BREAST BIOPSY;  Surgeon: Jamesetta So, MD;  Location: [facility];  Service: General;  Laterality: Left;  . BRE[facility] SURGERY [facility]    Current Outpatient Medications on File Prior to Visit  Medication Sig Dispense Refill  . acetaminophen (TYLENOL) 500 MG tablet Take 1,000 mg by mouth every 4 (four) hours as needed for moderate pain or headache.    Marland Kitchen  aspirin-acetaminophen-caffeine (EXCEDRIN MIGRAINE) 250-250-65 MG per tablet Take 2 tablets by mouth every 4 (four) hours as needed for headache.    . escitalopram (LEXAPRO) 10 MG tablet TAKE 1 TABLET BY MOUTH EVERY DAY 30 tablet 14  . Multiple Vitamin (MULTIVITAMIN WITH MINERALS) TABS tablet Take 1 tablet by mouth daily.    Marland Kitchen OVER THE COUNTER MEDICATION Goody's powder headache    . PROAIR HFA 108 (90 BASE) MCG/ACT inhaler INHALE 2 PUFFS BY MOUTH FOUR TIMES DAILY AS NEEDED FOR COUGHING SPELLS (Patient not taking: Reported on 05/20/2020)  0   No current facility-administered medications on file prior to visit.    Social History   Socioeconomic History  . Marital status: Married    Spouse name: Not on file  . Number of children: 2  . Years of education: Not on file  . Highest education level: Not on file  Occupational History  . Not on file  Tobacco Use  . Smoking status: Former Smoker    Years: 3.00    Types: Cigarettes  . Smokeless tobacco: Never Used  Vaping Use  . Vaping Use: Never used  Substance and Sexual Activity  . Alcohol use: No  . Drug use: No  . Sexual activity: Yes    Birth control/protection: None  Other Topics Concern  . Not on file  Social History Narrative  . Not on file   Social Determinants of Health   Financial Resource Strain:   . Difficulty of Paying Living  Expenses:   Food Insecurity:   . Worried About Charity fundraiser in the Last Year:   . Arboriculturist in the Last Year:   Transportation Needs:   . Film/video editor (Medical):   Marland Kitchen Lack of Transportation (Non-Medical):   Physical Activity:   . Days of Exercise per Week:   . Minutes of Exercise per Session:   Stress:   . Feeling of Stress :   Social Connections:   . Frequency of Communication with Friends and Family:   . Frequency of Social Gatherings with Friends and Family:   . Attends Religious Services:   . Active Member of Clubs or Organizations:   . Attends Archivist  Meetings:   Marland Kitchen Marital Status:   Intimate Partner Violence:   . Fear of Current or Ex-Partner:   . Emotionally Abused:   Marland Kitchen Physically Abused:   . Sexually Abused:     Family History  Problem Relation Age of Onset  . Hypertension Father   . Diabetes Father   . Heart disease Father   . Other Son        hernia, heart defect    BP (!) 137/87   Pulse 79   Ht 5\' 2"  (1.575 m)   Wt 185 lb (83.9 kg)   BMI 33.84 kg/m   Body mass index is 33.84 kg/m.     Objective:   Physical Exam Vitals and nursing note reviewed.  Constitutional:      Appearance: She is well-developed.  HENT:     Head: Normocephalic and atraumatic.  Eyes:     Conjunctiva/sclera: Conjunctivae normal.     Pupils: Pupils are equal, round, and reactive to light.  Cardiovascular:     Rate and Rhythm: Normal rate and regular rhythm.  Pulmonary:     Effort: Pulmonary effort is normal.  Abdominal:     Palpations: Abdomen is soft.  Musculoskeletal:     Cervical back: Normal range of motion and neck supple.       Legs:  Skin:    General: Skin is warm and dry.  Neurological:     Mental Status: She is alert and oriented to person, place, and time.     Cranial Nerves: No cranial nerve deficit.     Motor: No abnormal muscle tone.     Coordination: Coordination normal.     Deep Tendon Reflexes: Reflexes are normal and symmetric. Reflexes normal.  Psychiatric:        Behavior: Behavior normal.        Thought Content: Thought content normal.        Judgment: Judgment normal.   X-rays were done of the left knee, reported separately.         Assessment & Plan:   Encounter Diagnosis  Name Primary?  . Chronic pain of left knee Yes   I am concerned about a possible meniscus tear.  I will begin naprosyn 500 po bid pc  PROCEDURE NOTE:  The patient requests injections of the left knee , verbal consent was obtained.  The left knee was prepped appropriately after time out was performed.   Sterile  technique was observed and injection of 1 cc of Depo-Medrol 40 mg with several cc's of plain xylocaine. Anesthesia was provided by ethyl chloride and a 20-gauge needle was used to inject the knee area. The injection was tolerated well.  A band aid dressing was applied.  The patient was advised to apply ice later  today and tomorrow to the injection sight as needed.  She may need MRI.  Return in two weeks.  Call if any problem.  Precautions discussed.   Electronically Signed Sanjuana Kava, MD 7/27/20212:13 PM

## 2020-06-22 ENCOUNTER — Ambulatory Visit (INDEPENDENT_AMBULATORY_CARE_PROVIDER_SITE_OTHER): Payer: BC Managed Care – PPO | Admitting: Orthopaedic Surgery

## 2020-06-22 ENCOUNTER — Encounter: Payer: Self-pay | Admitting: Orthopaedic Surgery

## 2020-06-22 ENCOUNTER — Other Ambulatory Visit: Payer: Self-pay

## 2020-06-22 VITALS — BP 120/82 | HR 78 | Ht 62.0 in | Wt 184.0 lb

## 2020-06-22 DIAGNOSIS — M25562 Pain in left knee: Secondary | ICD-10-CM

## 2020-06-22 DIAGNOSIS — G8929 Other chronic pain: Secondary | ICD-10-CM

## 2020-06-22 NOTE — Progress Notes (Signed)
Patient Mary Rivera, female DOB:Dec 03, 1970, 49 y.o. Mary Rivera  Chief Complaint  Patient presents with  . Knee Pain    Left knee, some better, hurts after long periods of sitting and gets stiff.    HPI  Mary Rivera is a 49 y.o. female who has left knee pain.  She is better but still has some pain.  She is taking her Naprosyn.  She is using a knee sleeve.  She has no new trauma.  Overall she is improved.   Body mass index is 33.65 kg/m.  ROS  Review of Systems  Constitutional: Positive for activity change.  Musculoskeletal: Positive for arthralgias, gait problem and joint swelling.  Neurological: Positive for headaches.  All other systems reviewed and are negative.   All other systems reviewed and are negative.  The following is a summary of the past history medically, past history surgically, known current medicines, social history and family history.  This information is gathered electronically by the computer from prior information and documentation.  I review this each visit and have found including this information at this point in the chart is beneficial and informative.    Past Medical History:  Diagnosis Date  . ASCUS with positive high risk HPV cervical 10/25/2016  . Body aches 10/18/2015  . Cervical high risk HPV (human papillomavirus) test positive 10/19/2016  . Depression   . Frequent headaches 10/08/2013   Usually has has around period  . Otitis media of left ear 10/15/2014  . Sinus infection 10/15/2014    Past Surgical History:  Procedure Laterality Date  . BREAST BIOPSY Left 12/11/2014   Procedure: LEFT BREAST BIOPSY;  Surgeon: Jamesetta So, MD;  Location: [facility];  Service: General;  Laterality: Left;  . [facility]    Family History  Problem Relation Age of Onset  . Hypertension Father   . Diabetes Father   . Heart disease Father   . Other Son        hernia, heart defect    Social History Social  History   Tobacco Use  . Smoking status: Former Smoker    Years: 3.00    Types: Cigarettes  . Smokeless tobacco: Never Used  Vaping Use  . Vaping Use: Never used  Substance Use Topics  . Alcohol use: No  . Drug use: No    No Known Allergies  Current Outpatient Medications  Medication Sig Dispense Refill  . acetaminophen (TYLENOL) 500 MG tablet Take 1,000 mg by mouth every 4 (four) hours as needed for moderate pain or headache.    Marland Kitchen aspirin-acetaminophen-caffeine (EXCEDRIN MIGRAINE) 250-250-65 MG per tablet Take 2 tablets by mouth every 4 (four) hours as needed for headache.    . escitalopram (LEXAPRO) 10 MG tablet TAKE 1 TABLET BY MOUTH EVERY DAY 30 tablet 14  . Multiple Vitamin (MULTIVITAMIN WITH MINERALS) TABS tablet Take 1 tablet by mouth daily.    . naproxen (NAPROSYN) 500 MG tablet Take 1 tablet (500 mg total) by mouth 2 (two) times daily with a meal. 60 tablet 5  . OVER THE COUNTER MEDICATION Goody's powder headache    . PROAIR HFA 108 (90 BASE) MCG/ACT inhaler INHALE 2 PUFFS BY MOUTH FOUR TIMES DAILY AS NEEDED FOR COUGHING SPELLS  0   No current facility-administered medications for this visit.     Physical Exam  Blood pressure 120/82, pulse 78, height 5\' 2"  (1.575 m), weight 184 lb (83.5 kg).  Constitutional: overall normal hygiene,  normal nutrition, well developed, normal grooming, normal body habitus. Assistive device:knee sleeve  Musculoskeletal: gait and station Limp none, muscle tone and strength are normal, no tremors or atrophy is present.  .  Neurological: coordination overall normal.  Deep tendon reflex/nerve stretch intact.  Sensation normal.  Cranial nerves II-XII intact.   Skin:   Normal overall no scars, lesions, ulcers or rashes. No psoriasis.  Psychiatric: Alert and oriented x 3.  Recent memory intact, remote memory unclear.  Normal mood and affect. Well groomed.  Good eye contact.  Cardiovascular: overall no swelling, no varicosities, no edema  bilaterally, normal temperatures of the legs and arms, no clubbing, cyanosis and good capillary refill.  Left knee has nearly full ROM, no limp, no effusion, little pain. NV intact.  Lymphatic: palpation is normal.  All other systems reviewed and are negative   The patient has been educated about the nature of the problem(s) and counseled on treatment options.  The patient appeared to understand what I have discussed and is in agreement with it.  Encounter Diagnosis  Name Primary?  . Chronic pain of left knee Yes    PLAN Call if any problems.  Precautions discussed.  Continue current medications.   Return to clinic 3 weeks   Electronically Signed Sanjuana Kava, MD 8/10/20213:13 PM

## 2020-07-13 ENCOUNTER — Ambulatory Visit (INDEPENDENT_AMBULATORY_CARE_PROVIDER_SITE_OTHER): Payer: BC Managed Care – PPO | Admitting: Orthopaedic Surgery

## 2020-07-13 ENCOUNTER — Encounter: Payer: Self-pay | Admitting: Orthopaedic Surgery

## 2020-07-13 ENCOUNTER — Other Ambulatory Visit: Payer: Self-pay

## 2020-07-13 VITALS — BP 133/93 | HR 83 | Ht 62.0 in | Wt 192.0 lb

## 2020-07-13 DIAGNOSIS — M25562 Pain in left knee: Secondary | ICD-10-CM | POA: Diagnosis not present

## 2020-07-13 DIAGNOSIS — G8929 Other chronic pain: Secondary | ICD-10-CM | POA: Diagnosis not present

## 2020-07-13 NOTE — Progress Notes (Signed)
Patient HC:WCBJSE Mary Rivera, female DOB:Dec 25, 1970, 49 y.o. GBT:[id number]  Chief Complaint  Patient presents with  . Knee Pain    left knee pain, worse and is using right knee to compensate,    HPI  Mary Rivera is a 49 y.o. female who is having more and more pain of the left knee.  She had giving way beginning last week and has more swelling. I am concerned about possible meniscus tear and need for surgery.  I will get MRI of the left knee.  Continue her knee sleeve.   Body mass index is 35.12 kg/m.  ROS  Review of Systems  Constitutional: Positive for activity change.  Musculoskeletal: Positive for arthralgias, gait problem and joint swelling.  Neurological: Positive for headaches.  All other systems reviewed and are negative.   All other systems reviewed and are negative.  The following is a summary of the past history medically, past history surgically, known current medicines, social history and family history.  This information is gathered electronically by the computer from prior information and documentation.  I review this each visit and have found including this information at this point in the chart is beneficial and informative.    Past Medical History:  Diagnosis Date  . ASCUS with positive high risk HPV cervical 10/25/2016  . Body aches 10/18/2015  . Cervical high risk HPV (human papillomavirus) test positive 10/19/2016  . Depression   . Frequent headaches 10/08/2013   Usually has has around period  . Otitis media of left ear 10/15/2014  . Sinus infection 10/15/2014    Past Surgical History:  Procedure Laterality Date  . BREAST BIOPSY Left 12/11/2014   Procedure: LEFT BREAST BIOPSY;  Surgeon: Jamesetta So, MD;  Location: [facility];  Service: General;  Laterality: Left;  . [facility] SUR[facility]ERY [facility]    Family History  Problem Relation Age of Onset  . Hypertension Father   . Diabetes Father   . Heart disease Father   . Other Son         hernia, heart defect    Social History Social History   Tobacco Use  . Smoking status: Former Smoker    Years: 3.00    Types: Cigarettes  . Smokeless tobacco: Never Used  Vaping Use  . Vaping Use: Never used  Substance Use Topics  . Alcohol use: No  . Drug use: No    No Known Allergies  Current Outpatient Medications  Medication Sig Dispense Refill  . acetaminophen (TYLENOL) 500 MG tablet Take 1,000 mg by mouth every 4 (four) hours as needed for moderate pain or headache.    Marland Kitchen aspirin-acetaminophen-caffeine (EXCEDRIN MIGRAINE) 250-250-65 MG per tablet Take 2 tablets by mouth every 4 (four) hours as needed for headache.    . escitalopram (LEXAPRO) 10 MG tablet TAKE 1 TABLET BY MOUTH EVERY DAY 30 tablet 14  . Multiple Vitamin (MULTIVITAMIN WITH MINERALS) TABS tablet Take 1 tablet by mouth daily.    . naproxen (NAPROSYN) 500 MG tablet Take 1 tablet (500 mg total) by mouth 2 (two) times daily with a meal. 60 tablet 5  . OVER THE COUNTER MEDICATION Goody's powder headache    . PROAIR HFA 108 (90 BASE) MCG/ACT inhaler INHALE 2 PUFFS BY MOUTH FOUR TIMES DAILY AS NEEDED FOR COUGHING SPELLS  0   No current facility-administered medications for this visit.     Physical Exam  Blood pressure (!) 133/93, pulse 83, height 5\' 2"  (1.575 m), weight  192 lb (87.1 kg).  Constitutional: overall normal hygiene, normal nutrition, well developed, normal grooming, normal body habitus. Assistive device:knee sleeve left  Musculoskeletal: gait and station Limp left, muscle tone and strength are normal, no tremors or atrophy is present.  .  Neurological: coordination overall normal.  Deep tendon reflex/nerve stretch intact.  Sensation normal.  Cranial nerves II-XII intact.   Skin:   Normal overall no scars, lesions, ulcers or rashes. No psoriasis.  Psychiatric: Alert and oriented x 3.  Recent memory intact, remote memory unclear.  Normal mood and affect. Well groomed.  Good eye  contact.  Cardiovascular: overall no swelling, no varicosities, no edema bilaterally, normal temperatures of the legs and arms, no clubbing, cyanosis and good capillary refill.  Lymphatic: palpation is normal.  Left knee with effusion, crepitus, medial joint line pain, positive medial McMurray, limp left, NV intact.  All other systems reviewed and are negative   The patient has been educated about the nature of the problem(s) and counseled on treatment options.  The patient appeared to understand what I have discussed and is in agreement with it.  Encounter Diagnosis  Name Primary?  . Chronic pain of left knee Yes    PLAN Call if any problems.  Precautions discussed.  Continue current medications.   Return to clinic 2 weeks   Get MRI of the left knee.  Electronically Signed Sanjuana Kava, MD 8/31/20213:21 PM

## 2020-07-23 ENCOUNTER — Telehealth: Payer: Self-pay | Admitting: Orthopaedic Surgery

## 2020-07-23 NOTE — Telephone Encounter (Signed)
I left a message for the patient to see if she has scheduled an appointment for 07/27/20 and I do not see that the MRI has been completed. I left a message for the patient to call back.

## 2020-07-26 NOTE — Telephone Encounter (Signed)
I called the patient and left a message to follow up and see if she was getting the MRI of the knee somewhere else. I asked her to call the office and let us know so we could re-schedule her for another day. Call back number provided.

## 2020-07-26 NOTE — Telephone Encounter (Signed)
Patient has been called and is aware to get her MRI appointment on 08/04/2020 to go to [facility] at 2:30 pm for a 3:00 PM scan. Appointment has been moved for 08/10/2020 with Dr. Luna Glasgow as well. No other concerns at this time.

## 2020-07-27 ENCOUNTER — Ambulatory Visit: Payer: BC Managed Care – PPO | Admitting: Orthopaedic Surgery

## 2020-08-04 ENCOUNTER — Ambulatory Visit (HOSPITAL_COMMUNITY): Payer: BC Managed Care – PPO

## 2020-08-09 ENCOUNTER — Ambulatory Visit (HOSPITAL_COMMUNITY)
Admission: RE | Admit: 2020-08-09 | Discharge: 2020-08-09 | Disposition: A | Discharge status: Home / Self Care | Payer: BC Managed Care – PPO | Source: Ambulatory Visit | Attending: Orthopaedic Surgery | Admitting: Orthopaedic Surgery

## 2020-08-09 ENCOUNTER — Other Ambulatory Visit: Payer: Self-pay

## 2020-08-09 DIAGNOSIS — M25562 Pain in left knee: Secondary | ICD-10-CM | POA: Insufficient documentation

## 2020-08-09 DIAGNOSIS — G8929 Other chronic pain: Secondary | ICD-10-CM | POA: Diagnosis not present

## 2020-08-10 ENCOUNTER — Ambulatory Visit (INDEPENDENT_AMBULATORY_CARE_PROVIDER_SITE_OTHER): Payer: BC Managed Care – PPO | Admitting: Orthopaedic Surgery

## 2020-08-10 ENCOUNTER — Encounter: Payer: Self-pay | Admitting: Orthopaedic Surgery

## 2020-08-10 VITALS — BP 146/78 | HR 75 | Ht 62.0 in | Wt 192.0 lb

## 2020-08-10 DIAGNOSIS — G8929 Other chronic pain: Secondary | ICD-10-CM | POA: Diagnosis not present

## 2020-08-10 DIAGNOSIS — M25562 Pain in left knee: Secondary | ICD-10-CM | POA: Diagnosis not present

## 2020-08-10 NOTE — Progress Notes (Signed)
Patient Mary Rivera, female DOB:Dec 29, 1970, 49 y.o. [id number]:[id number]  Chief Complaint  Patient presents with  . Knee Pain    left  . Results    review MRI     HPI  Mary Rivera is a 49 y.o. female who has left knee pain.  MRI was done and showed: IMPRESSION: Negative for meniscal or ligament tear.  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  I have explained the procedure to her.  I would not recommend any surgery.  She is to continue the Naprosyn.      Body mass index is 35.12 kg/m.  ROS  Review of Systems  All other systems reviewed and are negative.  The following is a summary of the past history medically, past history surgically, known current medicines, social history and family history.  This information is gathered electronically by the computer from prior information and documentation.  I review this each visit and have found including this information at this point in the chart is beneficial and informative.    Past Medical History:  Diagnosis Date  . ASCUS with positive high risk HPV cervical 10/25/2016  . Body aches 10/18/2015  . Cervical high risk HPV (human papillomavirus) test positive 10/19/2016  . Depression   . Frequent headaches 10/08/2013   Usually has has around period  . Otitis media of left ear 10/15/2014  . Sinus infection 10/15/2014    Past Surgical History:  Procedure Laterality Date  . BREAST BIOPSY Left 12/11/2014   Procedure: LEFT BREAST BIOPSY;  Surgeon: Jamesetta So, MD;  Location: [facility];  Service: General;  Laterality: Left;  . [facility]    Family History  Problem Relation Age of Onset  . Hypertension Father   . Diabetes Father   . Heart disease Father   . Other Son        hernia, heart defect    Social History Social History   Tobacco Use  . Smoking status: Former Smoker     Years: 3.00    Types: Cigarettes  . Smokeless tobacco: Never Used  Vaping Use  . Vaping Use: Never used  Substance Use Topics  . Alcohol use: No  . Drug use: No    No Known Allergies  Current Outpatient Medications  Medication Sig Dispense Refill  . acetaminophen (TYLENOL) 500 MG tablet Take 1,000 mg by mouth every 4 (four) hours as needed for moderate pain or headache.    Marland Kitchen aspirin-acetaminophen-caffeine (EXCEDRIN MIGRAINE) 250-250-65 MG per tablet Take 2 tablets by mouth every 4 (four) hours as needed for headache.    . escitalopram (LEXAPRO) 10 MG tablet TAKE 1 TABLET BY MOUTH EVERY DAY 30 tablet 14  . Multiple Vitamin (MULTIVITAMIN WITH MINERALS) TABS tablet Take 1 tablet by mouth daily.    . naproxen (NAPROSYN) 500 MG tablet Take 1 tablet (500 mg total) by mouth 2 (two) times daily with a meal. 60 tablet 5  . OVER THE COUNTER MEDICATION Goody's powder headache    . PROAIR HFA 108 (90 BASE) MCG/ACT inhaler INHALE 2 PUFFS BY MOUTH FOUR TIMES DAILY AS NEEDED FOR COUGHING SPELLS  0   No current facility-administered medications for this visit.     Physical Exam  Blood pressure (!) 146/78, pulse 75, height 5\' 2"  (1.575 m), weight 192 lb (87.1 kg).  Constitutional: overall normal hygiene, normal nutrition, well developed, normal grooming, normal body habitus. Assistive device:none  Musculoskeletal: gait and station Limp left, muscle tone and strength are normal, no tremors or atrophy is present.  .  Neurological: coordination overall normal.  Deep tendon reflex/nerve stretch intact.  Sensation normal.  Cranial nerves II-XII intact.   Skin:   Normal overall no scars, lesions, ulcers or rashes. No psoriasis.  Psychiatric: Alert and oriented x 3.  Recent memory intact, remote memory unclear.  Normal mood and affect. Well groomed.  Good eye contact.  Cardiovascular: overall no swelling, no varicosities, no edema bilaterally, normal temperatures of the legs and arms, no clubbing,  cyanosis and good capillary refill.  Lymphatic: palpation is normal.  Left knee tender, ROM 0 to 115, some effusion, some crepitus. NV intact.  All other systems reviewed and are negative   The patient has been educated about the nature of the problem(s) and counseled on treatment options.  The patient appeared to understand what I have discussed and is in agreement with it.  Encounter Diagnosis  Name Primary?  . Chronic pain of left knee Yes    PLAN Call if any problems.  Precautions discussed.  Continue current medications.   Return to clinic 1 month   Electronically Signed Sanjuana Kava, MD 9/28/20213:19 PM

## 2020-08-17 DIAGNOSIS — L03211 Cellulitis of face: Secondary | ICD-10-CM | POA: Diagnosis not present

## 2020-09-07 ENCOUNTER — Encounter: Payer: Self-pay | Admitting: Orthopaedic Surgery

## 2020-09-07 ENCOUNTER — Other Ambulatory Visit: Payer: Self-pay

## 2020-09-07 ENCOUNTER — Ambulatory Visit: Payer: BC Managed Care – PPO | Admitting: Orthopaedic Surgery

## 2020-09-07 VITALS — BP 127/85 | HR 78 | Ht 62.0 in | Wt 183.0 lb

## 2020-09-07 DIAGNOSIS — G8929 Other chronic pain: Secondary | ICD-10-CM

## 2020-09-07 DIAGNOSIS — M25562 Pain in left knee: Secondary | ICD-10-CM

## 2020-09-07 NOTE — Progress Notes (Signed)
Patient Mary Rivera, female DOB:1970-11-15, 49 y.o. JQB:[id number]  Chief Complaint  Patient presents with   Knee Pain    left/ improved, some pain this am with cooler weather     HPI  Mary Rivera is a 49 y.o. female who has pain of the left knee.  She is a little better.  The cooler weather does irritate her and cause more pain but she has less pain now and she is taking her Naprosyn which helps.  She has no new trauma.  She has no redness.   Body mass index is 33.47 kg/m.  ROS   Review of Systems  Constitutional: Positive for activity change.  Musculoskeletal: Positive for arthralgias, gait problem and joint swelling.  Neurological: Positive for headaches.  All other systems reviewed and are negative.   All other systems reviewed and are negative.  The following is a summary of the past history medically, past history surgically, known current medicines, social history and family history.  This information is gathered electronically by the computer from prior information and documentation.  I review this each visit and have found including this information at this point in the chart is beneficial and informative.    Past Medical History:  Diagnosis Date   ASCUS with positive high risk HPV cervical 10/25/2016   Body aches 10/18/2015   Cervical high risk HPV (human papillomavirus) test positive 10/19/2016   Depression    Frequent headaches 10/08/2013   Usually has has around period   Otitis media of left ear 10/15/2014   Sinus infection 10/15/2014    Past Surgical History:  Procedure Laterality Date   BREAST BIOPSY Left 12/11/2014   Procedure: LEFT BREAST BIOPSY;  Surgeon: Jamesetta So, MD;  Location: [facility];  Service: General;  Laterality: Left;   [facility]    Family History  Problem Relation Age of Onset   Hypertension Father    Diabetes Father    Heart disease Father    Other Son        hernia, heart  defect    Social History Social History   Tobacco Use   Smoking status: Former Smoker    Years: 3.00    Types: Cigarettes   Smokeless tobacco: Never Used  Scientific laboratory technician Use: Never used  Substance Use Topics   Alcohol use: No   Drug use: No    No Known Allergies  Current Outpatient Medications  Medication Sig Dispense Refill   acetaminophen (TYLENOL) 500 MG tablet Take 1,000 mg by mouth every 4 (four) hours as needed for moderate pain or headache.     aspirin-acetaminophen-caffeine (EXCEDRIN MIGRAINE) 250-250-65 MG per tablet Take 2 tablets by mouth every 4 (four) hours as needed for headache.     escitalopram (LEXAPRO) 10 MG tablet TAKE 1 TABLET BY MOUTH EVERY DAY 30 tablet 14   Multiple Vitamin (MULTIVITAMIN WITH MINERALS) TABS tablet Take 1 tablet by mouth daily.     naproxen (NAPROSYN) 500 MG tablet Take 1 tablet (500 mg total) by mouth 2 (two) times daily with a meal. 60 tablet 5   OVER THE COUNTER MEDICATION Goody's powder headache     PROAIR HFA 108 (90 BASE) MCG/ACT inhaler INHALE 2 PUFFS BY MOUTH FOUR TIMES DAILY AS NEEDED FOR COUGHING SPELLS  0   No current facility-administered medications for this visit.     Physical Exam  Blood pressure 127/85, pulse 78, height 5\' 2"  (1.575 m), weight  183 lb (83 kg).  Constitutional: overall normal hygiene, normal nutrition, well developed, normal grooming, normal body habitus. Assistive device:none  Musculoskeletal: gait and station Limp none, muscle tone and strength are normal, no tremors or atrophy is present.  .  Neurological: coordination overall normal.  Deep tendon reflex/nerve stretch intact.  Sensation normal.  Cranial nerves II-XII intact.   Skin:   Normal overall no scars, lesions, ulcers or rashes. No psoriasis.  Psychiatric: Alert and oriented x 3.  Recent memory intact, remote memory unclear.  Normal mood and affect. Well groomed.  Good eye contact.  Cardiovascular: overall no swelling, no  varicosities, no edema bilaterally, normal temperatures of the legs and arms, no clubbing, cyanosis and good capillary refill.  Left knee with only slight effusion and crepitus. ROM is very good.  NV intact.  Stable.   Lymphatic: palpation is normal.  All other systems reviewed and are negative   The patient has been educated about the nature of the problem(s) and counseled on treatment options.  The patient appeared to understand what I have discussed and is in agreement with it.  Encounter Diagnosis  Name Primary?   Chronic pain of left knee Yes    PLAN Call if any problems.  Precautions discussed.  Continue current medications.   Return to clinic 6 weeks   Electronically Signed Sanjuana Kava, MD 10/26/20213:07 PM

## 2020-10-19 ENCOUNTER — Ambulatory Visit: Payer: BC Managed Care – PPO | Admitting: Orthopaedic Surgery

## 2020-10-26 ENCOUNTER — Ambulatory Visit: Payer: BC Managed Care – PPO | Admitting: Orthopaedic Surgery

## 2021-03-01 ENCOUNTER — Other Ambulatory Visit: Payer: Self-pay | Admitting: Adult Health

## 2021-07-13 ENCOUNTER — Telehealth: Payer: Self-pay | Admitting: Orthopaedic Surgery

## 2022-01-18 DIAGNOSIS — J209 Acute bronchitis, unspecified: Secondary | ICD-10-CM | POA: Diagnosis not present

## 2022-01-23 ENCOUNTER — Telehealth: Payer: Self-pay | Admitting: Orthopaedic Surgery

## 2022-03-29 ENCOUNTER — Other Ambulatory Visit: Payer: Self-pay | Admitting: Adult Health

## 2023-01-11 ENCOUNTER — Encounter: Payer: Self-pay | Admitting: Radiology

## 2024-06-18 ENCOUNTER — Encounter: Payer: Self-pay | Admitting: Adult Health

## 2024-06-18 ENCOUNTER — Other Ambulatory Visit (HOSPITAL_COMMUNITY)
Admission: RE | Admit: 2024-06-18 | Discharge: 2024-06-18 | Disposition: A | Discharge status: Home / Self Care | Source: Ambulatory Visit | Attending: Adult Health | Admitting: Adult Health

## 2024-06-18 ENCOUNTER — Ambulatory Visit (INDEPENDENT_AMBULATORY_CARE_PROVIDER_SITE_OTHER): Admitting: Adult Health

## 2024-06-18 VITALS — BP 126/84 | HR 68 | Ht 62.0 in | Wt 185.0 lb

## 2024-06-18 DIAGNOSIS — F419 Anxiety disorder, unspecified: Secondary | ICD-10-CM | POA: Diagnosis not present

## 2024-06-18 DIAGNOSIS — Z1211 Encounter for screening for malignant neoplasm of colon: Secondary | ICD-10-CM | POA: Diagnosis not present

## 2024-06-18 DIAGNOSIS — Z1329 Encounter for screening for other suspected endocrine disorder: Secondary | ICD-10-CM | POA: Diagnosis not present

## 2024-06-18 DIAGNOSIS — Z131 Encounter for screening for diabetes mellitus: Secondary | ICD-10-CM | POA: Insufficient documentation

## 2024-06-18 DIAGNOSIS — Z780 Asymptomatic menopausal state: Secondary | ICD-10-CM

## 2024-06-18 DIAGNOSIS — Z01419 Encounter for gynecological examination (general) (routine) without abnormal findings: Secondary | ICD-10-CM | POA: Diagnosis not present

## 2024-06-18 DIAGNOSIS — Z1231 Encounter for screening mammogram for malignant neoplasm of breast: Secondary | ICD-10-CM | POA: Insufficient documentation

## 2024-06-18 DIAGNOSIS — Z1331 Encounter for screening for depression: Secondary | ICD-10-CM

## 2024-06-18 DIAGNOSIS — G479 Sleep disorder, unspecified: Secondary | ICD-10-CM | POA: Diagnosis not present

## 2024-06-18 DIAGNOSIS — F32A Depression, unspecified: Secondary | ICD-10-CM

## 2024-06-18 DIAGNOSIS — Z13220 Encounter for screening for lipoid disorders: Secondary | ICD-10-CM | POA: Insufficient documentation

## 2024-06-18 DIAGNOSIS — B369 Superficial mycosis, unspecified: Secondary | ICD-10-CM

## 2024-06-18 LAB — HEMOCCULT GUIAC POC 1CARD (OFFICE): Fecal Occult Blood, POC: NEGATIVE

## 2024-06-18 MED ORDER — TRAZODONE HCL 50 MG PO TABS: 50.0000 mg | ORAL_TABLET | Freq: Every day | ORAL | 3 refills | Status: DC

## 2024-06-18 MED ORDER — NYSTATIN 100000 UNIT/GM EX POWD: 1.0000 | Freq: Two times a day (BID) | CUTANEOUS | 1 refills | Status: AC

## 2024-06-18 NOTE — Progress Notes (Signed)
 Patient ID: Mary Rivera, female   DOB: 1971/03/09, 53 y.o.   MRN: [id number] History of Present Illness: Mary Rivera is a 53 year old white female, married, PM in for a well woman gyn exam and pap. She has put off coming had been caring for mom and she passed about 3 years ago. Has not been seen in over 3 years.  PCP is Dr Alphonsa    Current Medications, Allergies, Past Medical History, Past Surgical History, Family History and Social History were reviewed in [employer] [facility] electronic medical record.     Review of Systems: Patient denies any headaches, hearing loss, fatigue, blurred vision, shortness of breath, chest pain, abdominal pain, problems with bowel movements, urination, or intercourse. No mood swings. Left knee aches at times, but on feet at work Denies any vaginal bleeding  Has anxiety and depression and is not sleeping well  Physical Exam:BP 126/84 (BP Location: Right Arm, Patient Position: Sitting, Cuff Size: Normal)   Pulse 68   Ht 5' 2 (1.575 m)   Wt 185 lb (83.9 kg)   LMP 02/27/2020   BMI 33.84 kg/m   General:  Well developed, well nourished, no acute distress Skin:  Warm and dry Neck:  Midline trachea, normal thyroid , good ROM, no lymphadenopathy Lungs; Clear to auscultation bilaterally Breast:  No dominant palpable mass, retraction, or nipple discharge, has skin fungus under breasts  Cardiovascular: Regular rate and rhythm Abdomen:  Soft, non tender, no hepatosplenomegaly Pelvic:  External genitalia is normal in appearance, no lesions.  The vagina is normal in appearance. Urethra has no lesions or masses. The cervix is smooth, pap with HR HPV genotyping performed.   Uterus is felt to be normal size, shape, and contour.  No adnexal masses or tenderness noted.Bladder is non tender, no masses felt. Rectal: Good sphincter tone, no polyps, or hemorrhoids felt.  Hemoccult negative. Extremities/musculoskeletal:  No swelling or varicosities noted, no clubbing or cyanosis Psych:   No mood changes, alert and cooperative,seems happy AA is 0 Fall risk is low    06/18/2024    3:32 PM 05/20/2020    5:00 PM 01/22/2019    2:39 PM  Depression screen PHQ 2/9  Decreased Interest 1 0 0  Down, Depressed, Hopeless 1 0 0  PHQ - 2 Score 2 0 0  Altered sleeping 3  1  Tired, decreased energy 1  1  Change in appetite 2  1  Feeling bad or failure about yourself  0    Trouble concentrating 0  0  Moving slowly or fidgety/restless 0  0  Suicidal thoughts 0  0  PHQ-9 Score 8  3  Difficult doing work/chores   Not difficult at all       06/18/2024    3:32 PM  GAD 7 : Generalized Anxiety Score  Nervous, Anxious, on Edge 1  Control/stop worrying 0  Worry too much - different things 0  Trouble relaxing 2  Restless 1  Easily annoyed or irritable 3  Afraid - awful might happen 0  Total GAD 7 Score 7    Upstream - 06/18/24 1538       Pregnancy Intention Screening   Does the patient want to become pregnant in the next year? N/A    Does the patient's partner want to become pregnant in the next year? N/A    Would the patient like to discuss contraceptive options today? N/A      Contraception Wrap Up   Current Method No Method - Other  Reason   PM   End Method No Method - Other Reason   PM   Contraception Counseling Provided No           Examination chaperoned by Clarita Salt LPN  Impression and plan: 1. Encounter for gynecological examination with Papanicolaou smear of cervix (Primary) Pap sent Pap in 3 years if negative Physical in 1 year Will check labs  - Cytology - PAP( [facility]) - CBC - Comprehensive metabolic panel with GFR - Lipid panel  2. Encounter for screening fecal occult blood testing Hemoccult was negative  - POCT occult blood stool  3. Anxiety and depression Will rx trazodone  50 mg 1 at hs, hoping to help with sleep and anxiety and depression Meds ordered this encounter  Medications   traZODone  (DESYREL ) 50 MG tablet    Sig: Take 1 tablet (50  mg total) by mouth at bedtime.    Dispense:  30 tablet    Refill:  3    Supervising Provider:   JAYNE MINDER H [[id number]]   nystatin  (MYCOSTATIN /NYSTOP ) powder    Sig: Apply 1 Application topically 2 (two) times daily.    Dispense:  60 g    Refill:  1    Supervising Provider:   JAYNE MINDER H [[id number]]   Follow up in 8 weeks for ROS   4. Sleep disturbance Will rx trazodone   5. Superficial fungus infection of skin Will rx nystatin  powders Try to keep dry   6. Screening for colorectal cancer Will order cologuard  - Cologuard  7. Screening mammogram for breast cancer Mammogram scheduled for 06/30/24 at 3:45 pm at [facility]  - MM 3D SCREENING MAMMOGRAM BILATERAL BREAST; Future  8. Screening cholesterol level - Lipid panel  9. Screening for thyroid  disorder - TSH + free T4  10. Screening for diabetes mellitus  - Hemoglobin A1c  11. Postmenopause Denies any bleeding, last period over 3 years ago

## 2024-06-19 LAB — LIPID PANEL
Chol/HDL Ratio: 4.6 ratio — ABNORMAL HIGH (ref 0.0–4.4)
Cholesterol, Total: 271 mg/dL — ABNORMAL HIGH (ref 100–199)
HDL: 59 mg/dL (ref >39)
LDL Chol Calc (NIH): 196 mg/dL — ABNORMAL HIGH (ref 0–99)
Triglycerides: 95 mg/dL (ref 0–149)
VLDL Cholesterol Cal: 16 mg/dL (ref 5–40)

## 2024-06-19 LAB — HEMOGLOBIN A1C
Est. average glucose Bld gHb Est-mCnc: 117 mg/dL
Hgb A1c MFr Bld: 5.7 % — ABNORMAL HIGH (ref 4.8–5.6)

## 2024-06-19 LAB — COMPREHENSIVE METABOLIC PANEL WITH GFR
ALT: 21 IU/L (ref 0–32)
AST: 16 IU/L (ref 0–40)
Albumin: 5.1 g/dL — ABNORMAL HIGH (ref 3.8–4.9)
Alkaline Phosphatase: 82 IU/L (ref 44–121)
BUN/Creatinine Ratio: 15 (ref 9–23)
BUN: 11 mg/dL (ref 6–24)
Bilirubin Total: 0.4 mg/dL (ref 0.0–1.2)
CO2: 24 mmol/L (ref 20–29)
Calcium: 10.6 mg/dL — ABNORMAL HIGH (ref 8.7–10.2)
Chloride: 100 mmol/L (ref 96–106)
Creatinine, Ser: 0.73 mg/dL (ref 0.57–1.00)
Globulin, Total: 2.7 g/dL (ref 1.5–4.5)
Glucose: 96 mg/dL (ref 70–99)
Potassium: 4.2 mmol/L (ref 3.5–5.2)
Sodium: 140 mmol/L (ref 134–144)
Total Protein: 7.8 g/dL (ref 6.0–8.5)
eGFR: 99 mL/min/1.73 (ref >59)

## 2024-06-19 LAB — TSH+FREE T4
Free T4: 1.3 ng/dL (ref 0.82–1.77)
TSH: 1.85 u[IU]/mL (ref 0.450–4.500)

## 2024-06-19 LAB — CBC
Hematocrit: 45 % (ref 34.0–46.6)
Hemoglobin: 14.6 g/dL (ref 11.1–15.9)
MCH: 29.9 pg (ref 26.6–33.0)
MCHC: 32.4 g/dL (ref 31.5–35.7)
MCV: 92 fL (ref 79–97)
Platelets: 408 x10E3/uL (ref 150–450)
RBC: 4.88 x10E6/uL (ref 3.77–5.28)
RDW: 12.3 % (ref 11.7–15.4)
WBC: 8 x10E3/uL (ref 3.4–10.8)

## 2024-06-20 ENCOUNTER — Ambulatory Visit: Payer: Self-pay | Admitting: Adult Health

## 2024-06-20 ENCOUNTER — Other Ambulatory Visit: Payer: Self-pay | Admitting: Adult Health

## 2024-06-20 DIAGNOSIS — E7800 Pure hypercholesterolemia, unspecified: Secondary | ICD-10-CM | POA: Insufficient documentation

## 2024-06-20 NOTE — Progress Notes (Signed)
 Ck lipids and CMP in November

## 2024-06-25 LAB — CYTOLOGY - PAP
Comment: NEGATIVE
Diagnosis: NEGATIVE
Diagnosis: REACTIVE
High risk HPV: NEGATIVE

## 2024-06-30 ENCOUNTER — Ambulatory Visit (HOSPITAL_COMMUNITY)
Admission: RE | Admit: 2024-06-30 | Discharge: 2024-06-30 | Disposition: A | Discharge status: Home / Self Care | Source: Ambulatory Visit | Attending: Adult Health | Admitting: Adult Health

## 2024-06-30 ENCOUNTER — Encounter (HOSPITAL_COMMUNITY): Payer: Self-pay

## 2024-06-30 DIAGNOSIS — Z1231 Encounter for screening mammogram for malignant neoplasm of breast: Secondary | ICD-10-CM | POA: Insufficient documentation

## 2024-07-04 ENCOUNTER — Encounter: Payer: Self-pay | Admitting: Radiology

## 2024-08-13 ENCOUNTER — Encounter: Payer: Self-pay | Admitting: Adult Health

## 2024-08-13 ENCOUNTER — Ambulatory Visit (INDEPENDENT_AMBULATORY_CARE_PROVIDER_SITE_OTHER): Admitting: Adult Health

## 2024-08-13 VITALS — BP 122/82 | HR 73 | Ht 62.0 in | Wt 181.0 lb

## 2024-08-13 DIAGNOSIS — G479 Sleep disorder, unspecified: Secondary | ICD-10-CM | POA: Diagnosis not present

## 2024-08-13 DIAGNOSIS — F419 Anxiety disorder, unspecified: Secondary | ICD-10-CM | POA: Diagnosis not present

## 2024-08-13 DIAGNOSIS — Z1331 Encounter for screening for depression: Secondary | ICD-10-CM | POA: Diagnosis not present

## 2024-08-13 DIAGNOSIS — F32A Depression, unspecified: Secondary | ICD-10-CM | POA: Diagnosis not present

## 2024-08-13 MED ORDER — TRAZODONE HCL 100 MG PO TABS: 100.0000 mg | ORAL_TABLET | Freq: Every day | ORAL | 4 refills | Status: DC

## 2024-08-13 NOTE — Progress Notes (Signed)
 Subjective:     Patient ID: Mary Rivera, female   DOB: 04-Jan-1971, 53 y.o.   MRN: [id number]  HPI Mary Rivera is a 53 year old white female, married, PM back in follow up on starting trazodone  in August for anxiety and depression and sleep. She says anxiety and depression is better but still not sleeping great, esp if gets to to pee, mind won't shut off.     Component Value Date/Time   DIAGPAP  06/18/2024 1541    - Negative for Intraepithelial Lesions or Malignancy (NILM)   DIAGPAP - Benign reactive/reparative changes 06/18/2024 1541   DIAGPAP  11/22/2017 0000    NEGATIVE FOR INTRAEPITHELIAL LESIONS OR MALIGNANCY.   HPVHIGH Negative 06/18/2024 1541   ADEQPAP  06/18/2024 1541    Satisfactory for evaluation; transformation zone component PRESENT.   ADEQPAP  11/22/2017 0000    Satisfactory for evaluation  endocervical/transformation zone component PRESENT.   ADEQPAP (A) 10/19/2016 0000    Satisfactory for evaluation  endocervical/transformation zone component PRESENT.    PCP is Dr Mary Rivera   Review of Systems Anxiety and depression is better Still not sleeping great Reviewed past medical,surgical, social and family history. Reviewed medications and allergies.     Objective:   Physical Exam BP 122/82 (BP Location: Right Arm, Patient Position: Sitting, Cuff Size: Normal)   Pulse 73   Ht 5' 2 (1.575 m)   Wt 181 lb (82.1 kg)   LMP 02/27/2020   BMI 33.11 kg/m  has lost 4 lbs Skin warm and dry. Lungs: clear to ausculation bilaterally. Cardiovascular: regular rate and rhythm.     08/13/2024    3:15 PM 06/18/2024    3:32 PM 05/20/2020    5:00 PM  Depression screen PHQ 2/9  Decreased Interest 0 1 0  Down, Depressed, Hopeless 0 1 0  PHQ - 2 Score 0 2 0  Altered sleeping 3 3   Tired, decreased energy 1 1   Change in appetite 0 2   Feeling bad or failure about yourself  0 0   Trouble concentrating 0 0   Moving slowly or fidgety/restless 0 0   Suicidal thoughts 0 0   PHQ-9 Score  4 8        08/13/2024    3:17 PM 06/18/2024    3:32 PM  GAD 7 : Generalized Anxiety Score  Nervous, Anxious, on Edge 0 1  Control/stop worrying 1 0  Worry too much - different things 1 0  Trouble relaxing 1 2  Restless 0 1  Easily annoyed or irritable 3 3  Afraid - awful might happen 0 0  Total GAD 7 Score 6 7      Upstream - 08/13/24 1523       Pregnancy Intention Screening   Does the patient want to become pregnant in the next year? N/A    Does the patient's partner want to become pregnant in the next year? N/A    Would the patient like to discuss contraceptive options today? N/A      Contraception Wrap Up   Current Method No Method - Other Reason   PM   End Method No Method - Other Reason   PM   Contraception Counseling Provided No             Assessment:     1. Anxiety and depression (Primary) Anxiety and depression is better Will increase trazodone  to 100 mg to see if sleep gets better  Meds ordered this encounter  Medications   traZODone  (DESYREL ) 100 MG tablet    Sig: Take 1 tablet (100 mg total) by mouth at bedtime.    Dispense:  30 tablet    Refill:  4    Supervising Provider:   JAYNE MINDER H [[id number]]     2. Sleep disturbance Still not sleeping great, mind won't shut off  Will increase trazodone  to 100 mg 1 at bedtime, can take 2  50 mg tablets       Plan:     Follow up 10/06/24 for ROS or sooner if needed

## 2024-09-15 ENCOUNTER — Encounter: Payer: Self-pay | Admitting: Radiology

## 2024-09-29 DIAGNOSIS — J209 Acute bronchitis, unspecified: Secondary | ICD-10-CM | POA: Diagnosis not present

## 2024-09-29 DIAGNOSIS — R059 Cough, unspecified: Secondary | ICD-10-CM | POA: Diagnosis not present

## 2024-10-07 ENCOUNTER — Encounter: Payer: Self-pay | Admitting: Adult Health

## 2024-10-07 ENCOUNTER — Ambulatory Visit: Admitting: Adult Health

## 2024-10-07 VITALS — BP 120/78 | HR 85 | Ht 62.0 in | Wt 184.5 lb

## 2024-10-07 DIAGNOSIS — F418 Other specified anxiety disorders: Secondary | ICD-10-CM | POA: Diagnosis not present

## 2024-10-07 DIAGNOSIS — F419 Anxiety disorder, unspecified: Secondary | ICD-10-CM

## 2024-10-07 DIAGNOSIS — G479 Sleep disorder, unspecified: Secondary | ICD-10-CM

## 2024-10-07 MED ORDER — TRAZODONE HCL 100 MG PO TABS: 100.0000 mg | ORAL_TABLET | Freq: Every day | ORAL | 6 refills | Status: AC

## 2024-10-07 NOTE — Progress Notes (Signed)
 Subjective:     Patient ID: Mary Rivera, female   DOB: [birth year], 53 y.o.   MRN: [id number]  HPI Mary Rivera is a 53 year old white female, married, PM back in follow up on incrasing trazodone  to 100 mg and is sleeping better. She is smiling today.     Component Value Date/Time   DIAGPAP  06/18/2024 1541    - Negative for Intraepithelial Lesions or Malignancy (NILM)   DIAGPAP - Benign reactive/reparative changes 06/18/2024 1541   DIAGPAP  11/22/2017 0000    NEGATIVE FOR INTRAEPITHELIAL LESIONS OR MALIGNANCY.   HPVHIGH Negative 06/18/2024 1541   ADEQPAP  06/18/2024 1541    Satisfactory for evaluation; transformation zone component PRESENT.   ADEQPAP  11/22/2017 0000    Satisfactory for evaluation  endocervical/transformation zone component PRESENT.   ADEQPAP (A) 10/19/2016 0000    Satisfactory for evaluation  endocervical/transformation zone component PRESENT.    PCP is Dr Alphonsa  Review of Systems Sleeping better    Reviewed past medical,surgical, social and family history. Reviewed medications and allergies.  Objective:   Physical Exam BP 120/78 (BP Location: Right Arm, Patient Position: Sitting, Cuff Size: Normal)   Pulse 85   Ht 5' 2 (1.575 m)   Wt 184 lb 8 oz (83.7 kg)   LMP 02/27/2020   BMI 33.75 kg/m     Skin warm and dry.  Lungs: clear to ausculation bilaterally. Cardiovascular: regular rate and rhythm.  Fall risk is low    10/07/2024    3:56 PM 08/13/2024    3:15 PM 06/18/2024    3:32 PM  Depression screen PHQ 2/9  Decreased Interest 0 0 1  Down, Depressed, Hopeless 0 0 1  PHQ - 2 Score 0 0 2  Altered sleeping 1 3 3   Tired, decreased energy 0 1 1  Change in appetite 0 0 2  Feeling bad or failure about yourself  0 0 0  Trouble concentrating 0 0 0  Moving slowly or fidgety/restless 0 0 0  Suicidal thoughts 0 0 0  PHQ-9 Score 1 4  8    Difficult doing work/chores Not difficult at all       Data saved with a previous flowsheet row definition        10/07/2024    3:58 PM 08/13/2024    3:17 PM 06/18/2024    3:32 PM  GAD 7 : Generalized Anxiety Score  Nervous, Anxious, on Edge 0 0 1  Control/stop worrying 0 1 0  Worry too much - different things 0 1 0  Trouble relaxing 0 1 2  Restless 0 0 1  Easily annoyed or irritable 0 3 3  Afraid - awful might happen 0 0 0  Total GAD 7 Score 0 6 7      Upstream - 10/07/24 1601       Pregnancy Intention Screening   Does the patient want to become pregnant in the next year? N/A    Does the patient's partner want to become pregnant in the next year? N/A    Would the patient like to discuss contraceptive options today? N/A      Contraception Wrap Up   Current Method Post-Menopause    End Method Post-Menopause    Contraception Counseling Provided No          Assessment:     1. Anxiety and depression (Primary) Feels good Will refill trazodone  100 mg 1 at hs Meds ordered this encounter  Medications   traZODone  (DESYREL ) 100 MG  tablet    Sig: Take 1 tablet (100 mg total) by mouth at bedtime.    Dispense:  30 tablet    Refill:  6    Supervising Provider:   JAYNE MINDER H [[id number]]     2. Sleep disturbance Sleeping better    Plan:     Follow up in 6 months or sooner if needed
